# Patient Record
Sex: Female | Born: 1950 | Race: White | Hispanic: No | Smoking: Former smoker
Health system: Southern US, Community
[De-identification: ages and names within clinical notes are randomized; demographics above are authoritative.]

## PROBLEM LIST (undated history)

## (undated) DIAGNOSIS — F32A Depression, unspecified: Secondary | ICD-10-CM

## (undated) DIAGNOSIS — R51 Headache: Secondary | ICD-10-CM

## (undated) DIAGNOSIS — T8859XA Other complications of anesthesia, initial encounter: Secondary | ICD-10-CM

## (undated) DIAGNOSIS — F329 Major depressive disorder, single episode, unspecified: Secondary | ICD-10-CM

## (undated) DIAGNOSIS — F419 Anxiety disorder, unspecified: Secondary | ICD-10-CM

## (undated) DIAGNOSIS — R519 Headache, unspecified: Secondary | ICD-10-CM

## (undated) DIAGNOSIS — M5126 Other intervertebral disc displacement, lumbar region: Secondary | ICD-10-CM

## (undated) DIAGNOSIS — M51369 Other intervertebral disc degeneration, lumbar region without mention of lumbar back pain or lower extremity pain: Secondary | ICD-10-CM

## (undated) DIAGNOSIS — R112 Nausea with vomiting, unspecified: Secondary | ICD-10-CM

## (undated) DIAGNOSIS — Z9889 Other specified postprocedural states: Secondary | ICD-10-CM

## (undated) DIAGNOSIS — M48061 Spinal stenosis, lumbar region without neurogenic claudication: Secondary | ICD-10-CM

## (undated) DIAGNOSIS — I10 Essential (primary) hypertension: Secondary | ICD-10-CM

## (undated) DIAGNOSIS — T4145XA Adverse effect of unspecified anesthetic, initial encounter: Secondary | ICD-10-CM

## (undated) DIAGNOSIS — M1990 Unspecified osteoarthritis, unspecified site: Secondary | ICD-10-CM

## (undated) DIAGNOSIS — M797 Fibromyalgia: Secondary | ICD-10-CM

## (undated) DIAGNOSIS — M5136 Other intervertebral disc degeneration, lumbar region: Secondary | ICD-10-CM

## (undated) DIAGNOSIS — Z8619 Personal history of other infectious and parasitic diseases: Secondary | ICD-10-CM

## (undated) DIAGNOSIS — A159 Respiratory tuberculosis unspecified: Secondary | ICD-10-CM

## (undated) HISTORY — PX: ABDOMINAL HYSTERECTOMY: SHX81

## (undated) HISTORY — PX: COLONOSCOPY: SHX174

## (undated) HISTORY — PX: BACK SURGERY: SHX140

## (undated) HISTORY — PX: WISDOM TOOTH EXTRACTION: SHX21

## (undated) HISTORY — PX: TONSILLECTOMY: SUR1361

## (undated) HISTORY — PX: TUBAL LIGATION: SHX77

## (undated) HISTORY — PX: APPENDECTOMY: SHX54

## (undated) HISTORY — PX: CHOLECYSTECTOMY: SHX55

---

## 1997-11-11 ENCOUNTER — Other Ambulatory Visit: Admission: RE | Admit: 1997-11-11 | Discharge: 1997-11-11 | Payer: Self-pay | Admitting: *Deleted

## 1998-02-12 ENCOUNTER — Ambulatory Visit (HOSPITAL_COMMUNITY): Admission: RE | Admit: 1998-02-12 | Discharge: 1998-02-12 | Payer: Self-pay | Admitting: *Deleted

## 1998-02-24 ENCOUNTER — Ambulatory Visit (HOSPITAL_COMMUNITY): Admission: RE | Admit: 1998-02-24 | Discharge: 1998-02-24 | Payer: Self-pay | Admitting: *Deleted

## 1999-04-23 ENCOUNTER — Other Ambulatory Visit: Admission: RE | Admit: 1999-04-23 | Discharge: 1999-04-23 | Payer: Self-pay | Admitting: *Deleted

## 2001-12-31 ENCOUNTER — Encounter: Admission: RE | Admit: 2001-12-31 | Discharge: 2001-12-31 | Payer: Self-pay | Admitting: Internal Medicine

## 2002-01-14 ENCOUNTER — Encounter: Admission: RE | Admit: 2002-01-14 | Discharge: 2002-01-14 | Payer: Self-pay | Admitting: Internal Medicine

## 2002-03-18 ENCOUNTER — Encounter: Admission: RE | Admit: 2002-03-18 | Discharge: 2002-03-18 | Payer: Self-pay | Admitting: Internal Medicine

## 2002-03-28 ENCOUNTER — Ambulatory Visit (HOSPITAL_COMMUNITY): Admission: RE | Admit: 2002-03-28 | Discharge: 2002-03-28 | Payer: Self-pay | Admitting: Internal Medicine

## 2002-04-02 ENCOUNTER — Encounter: Admission: RE | Admit: 2002-04-02 | Discharge: 2002-07-01 | Payer: Self-pay | Admitting: *Deleted

## 2003-05-13 ENCOUNTER — Emergency Department (HOSPITAL_COMMUNITY): Admission: AD | Admit: 2003-05-13 | Discharge: 2003-05-13 | Payer: Self-pay | Admitting: Family Medicine

## 2004-05-14 ENCOUNTER — Ambulatory Visit: Payer: Self-pay | Admitting: Internal Medicine

## 2004-05-17 ENCOUNTER — Ambulatory Visit: Payer: Self-pay | Admitting: Internal Medicine

## 2006-04-19 ENCOUNTER — Ambulatory Visit: Payer: Self-pay | Admitting: Obstetrics and Gynecology

## 2008-11-14 ENCOUNTER — Encounter: Payer: Self-pay | Admitting: Orthopedic Surgery

## 2009-06-23 ENCOUNTER — Ambulatory Visit: Payer: Self-pay | Admitting: Gynecology

## 2009-06-23 ENCOUNTER — Other Ambulatory Visit: Admission: RE | Admit: 2009-06-23 | Discharge: 2009-06-23 | Payer: Self-pay | Admitting: Gynecology

## 2009-07-02 ENCOUNTER — Ambulatory Visit: Payer: Self-pay | Admitting: Gynecology

## 2009-09-16 ENCOUNTER — Ambulatory Visit (HOSPITAL_COMMUNITY): Admission: RE | Admit: 2009-09-16 | Discharge: 2009-09-18 | Payer: Self-pay | Admitting: Specialist

## 2009-10-07 ENCOUNTER — Ambulatory Visit: Payer: Self-pay | Admitting: Gynecology

## 2009-10-21 ENCOUNTER — Emergency Department (HOSPITAL_COMMUNITY): Admission: EM | Admit: 2009-10-21 | Discharge: 2009-10-21 | Payer: Self-pay | Admitting: Emergency Medicine

## 2009-10-26 ENCOUNTER — Ambulatory Visit: Payer: Self-pay | Admitting: Gynecology

## 2009-10-26 ENCOUNTER — Ambulatory Visit (HOSPITAL_BASED_OUTPATIENT_CLINIC_OR_DEPARTMENT_OTHER): Admission: RE | Admit: 2009-10-26 | Discharge: 2009-10-27 | Payer: Self-pay | Admitting: Gynecology

## 2009-11-10 ENCOUNTER — Ambulatory Visit: Payer: Self-pay | Admitting: Gynecology

## 2009-11-26 ENCOUNTER — Ambulatory Visit: Payer: Self-pay | Admitting: Gynecology

## 2010-06-26 ENCOUNTER — Encounter: Payer: Self-pay | Admitting: Internal Medicine

## 2010-06-28 ENCOUNTER — Encounter: Payer: Self-pay | Admitting: Family Medicine

## 2010-08-23 LAB — POCT I-STAT, CHEM 8
BUN: 11 mg/dL (ref 6–23)
Chloride: 108 mEq/L (ref 96–112)
Chloride: 109 mEq/L (ref 96–112)
Creatinine, Ser: 0.5 mg/dL (ref 0.4–1.2)
Creatinine, Ser: 0.7 mg/dL (ref 0.4–1.2)
Glucose, Bld: 113 mg/dL — ABNORMAL HIGH (ref 70–99)
Glucose, Bld: 85 mg/dL (ref 70–99)
HCT: 37 % (ref 36.0–46.0)
HCT: 39 % (ref 36.0–46.0)
Hemoglobin: 12.6 g/dL (ref 12.0–15.0)
Potassium: 3.5 mEq/L (ref 3.5–5.1)
Sodium: 143 mEq/L (ref 135–145)
Sodium: 143 mEq/L (ref 135–145)

## 2010-08-23 LAB — CBC
Hemoglobin: 11.8 g/dL — ABNORMAL LOW (ref 12.0–15.0)
MCHC: 34 g/dL (ref 30.0–36.0)
MCV: 83.4 fL (ref 78.0–100.0)
Platelets: 379 10*3/uL (ref 150–400)
RBC: 4.16 MIL/uL (ref 3.87–5.11)
RDW: 13.2 % (ref 11.5–15.5)

## 2010-08-23 LAB — URINALYSIS, ROUTINE W REFLEX MICROSCOPIC
Glucose, UA: 100 mg/dL — AB
Protein, ur: 100 mg/dL — AB
Specific Gravity, Urine: 1.029 (ref 1.005–1.030)
pH: 6.5 (ref 5.0–8.0)

## 2010-08-23 LAB — URINE MICROSCOPIC-ADD ON

## 2010-08-25 LAB — COMPREHENSIVE METABOLIC PANEL
ALT: 17 U/L (ref 0–35)
Albumin: 4.2 g/dL (ref 3.5–5.2)
Calcium: 9.6 mg/dL (ref 8.4–10.5)
Creatinine, Ser: 0.66 mg/dL (ref 0.4–1.2)
GFR calc Af Amer: >60 mL/min (ref >60)
GFR calc non Af Amer: >60 mL/min (ref >60)
Glucose, Bld: 105 mg/dL — ABNORMAL HIGH (ref 70–99)
Potassium: 3.6 mEq/L (ref 3.5–5.1)
Total Protein: 7.7 g/dL (ref 6.0–8.3)

## 2010-08-25 LAB — BASIC METABOLIC PANEL
CO2: 25 mEq/L (ref 19–32)
Chloride: 104 mEq/L (ref 96–112)
Creatinine, Ser: 0.6 mg/dL (ref 0.4–1.2)
GFR calc Af Amer: >60 mL/min (ref >60)
Glucose, Bld: 120 mg/dL — ABNORMAL HIGH (ref 70–99)
Potassium: 3.7 mEq/L (ref 3.5–5.1)
Sodium: 138 mEq/L (ref 135–145)

## 2010-08-25 LAB — APTT: aPTT: 32 seconds (ref 24–37)

## 2010-08-25 LAB — URINALYSIS, ROUTINE W REFLEX MICROSCOPIC
Ketones, ur: NEGATIVE mg/dL
Nitrite: NEGATIVE
Protein, ur: NEGATIVE mg/dL
Specific Gravity, Urine: 1.021 (ref 1.005–1.030)

## 2010-08-25 LAB — CBC
HCT: 32.4 % — ABNORMAL LOW (ref 36.0–46.0)
HCT: 40 % (ref 36.0–46.0)
Hemoglobin: 11.3 g/dL — ABNORMAL LOW (ref 12.0–15.0)
Hemoglobin: 13.8 g/dL (ref 12.0–15.0)
MCV: 83.9 fL (ref 78.0–100.0)
Platelets: 234 10*3/uL (ref 150–400)
RBC: 4.76 MIL/uL (ref 3.87–5.11)
WBC: 10.7 10*3/uL — ABNORMAL HIGH (ref 4.0–10.5)

## 2010-08-25 LAB — TYPE AND SCREEN: Antibody Screen: NEGATIVE

## 2010-08-25 LAB — ABO/RH: ABO/RH(D): B POS

## 2010-08-25 LAB — DIFFERENTIAL
Basophils Relative: 0 % (ref 0–1)
Neutrophils Relative %: 68 % (ref 43–77)

## 2010-08-25 LAB — PROTIME-INR
INR: 1.04 (ref 0.00–1.49)
Prothrombin Time: 13.5 seconds (ref 11.6–15.2)

## 2010-10-22 NOTE — Group Therapy Note (Signed)
NAMERAYLAN, TROIANI             ACCOUNT NO.:  [id number]   MEDICAL RECORD NO.:  [id number]          PATIENT TYPE:  WOC   LOCATION:  [facility]                   FACILITY:  [facility]   PHYSICIAN:  Argentina Donovan, MD        DATE OF BIRTH:  04-May-1951   DATE OF SERVICE:                                    CLINIC NOTE   The patient is a 60 year old postmenopausal patient who is sent in by her  primary doctor, Dr. Egbert Garibaldi, who recently did an endometrial biopsy for  postmenopausal bleeding, and found a benign endometrium with small evidence  of a polyp.  She has not had any bleeding now in over a month, and she also  complains of some cramping with her period starting, especially on the right  side and radiating around to the back, but no bleeding.  We are going to get  an ultrasound to make sure she has no sign of any ovarian pathology, and  evaluate the uterus a little bit better.  We have told her if the bleeding  restarts, I want to do a more formal D&C and hysteroscopy, and she is going  to call.  In addition to that, she has had a recent Pap smear, which has  been completely normal.   IMPRESSION:  1. Postmenopausal bleeding, resolved.  2. Endometrial biopsy was done by a previous doctor.  3. Pelvic pain.           ______________________________  Argentina Donovan, MD     PR/MEDQ  D:  04/19/2006  T:  04/19/2006  Job:  [id number]

## 2013-04-01 ENCOUNTER — Encounter (HOSPITAL_COMMUNITY): Payer: Self-pay | Admitting: Pharmacy Technician

## 2013-04-02 ENCOUNTER — Other Ambulatory Visit (HOSPITAL_COMMUNITY): Payer: Self-pay | Admitting: *Deleted

## 2013-04-02 NOTE — Patient Instructions (Addendum)
20      Your procedure is scheduled on:  Tuesday 04/09/2013  Report to [facility] at  0915 AM.  Call this number if you have problems the night before or morning of surgery: [phone]   Remember:             IF YOU USE CPAP,BRING MASK AND TUBING AM OF SURGERY!   Do not eat food or drink liquids AFTER MIDNIGHT!  Take these medicines the morning of surgery with A SIP OF WATER:Atenolol, Amlodipine, Wellbutrin , Hydrocodone if needed   Do not bring valuables to the hospital. [facility] IS NOT RESPONSIBLE  FOR ANY BELONGINGS OR VALUABLES BROUGHT TO HOSPITAL.  Marland Kitchen  Leave suitcase in the car. After surgery it may be brought to your room.  For patients admitted to the hospital, checkout time is 11:00 AM the day of              Discharge.    DO NOT WEAR JEWELRY , MAKE-UP, LOTIONS,POWDERS,PERFUMES!             WOMEN -DO NOT SHAVE LEGS OR UNDERARMS 12 HRS. BEFORE SURGERY!               MEN MAY SHAVE AS USUAL!             CONTACTS,DENTURES OR BRIDGEWORK, FALSE EYELASHES MAY NOT BE WORN INTO SURGERY!                                           Patients discharged the day of surgery will not be allowed to drive home. If going home the same day of surgery, must have someone stay with you first 24 hrs.at home and arrange for someone to drive you home from the Hospital.                       YOUR DRIVER IS:N/A   Special Instructions:             Please read over the following fact sheets that you were given:             1. [place] PREPARING FOR SURGERY SHEET              2.INCENTIVE SPIROMETRY                                        [place].Kathaleen Dudziak,RN,BSN     [id number]                FAILURE TO FOLLOW THESE INSTRUCTIONS MAY RESULT IN  CANCELLATION OF YOUR SURGERY!               Patient Signature:___________________________

## 2013-04-03 ENCOUNTER — Encounter (HOSPITAL_COMMUNITY): Payer: Self-pay

## 2013-04-03 ENCOUNTER — Ambulatory Visit (HOSPITAL_COMMUNITY)
Admission: RE | Admit: 2013-04-03 | Discharge: 2013-04-03 | Disposition: A | Discharge status: Home / Self Care | Payer: Medicare Other | Source: Ambulatory Visit | Attending: Orthopedic Surgery | Admitting: Orthopedic Surgery

## 2013-04-03 ENCOUNTER — Encounter (HOSPITAL_COMMUNITY)
Admission: RE | Admit: 2013-04-03 | Discharge: 2013-04-03 | Disposition: A | Discharge status: Home / Self Care | Payer: Medicare Other | Source: Ambulatory Visit | Attending: Orthopedic Surgery | Admitting: Orthopedic Surgery

## 2013-04-03 DIAGNOSIS — M479 Spondylosis, unspecified: Secondary | ICD-10-CM | POA: Insufficient documentation

## 2013-04-03 DIAGNOSIS — R091 Pleurisy: Secondary | ICD-10-CM | POA: Insufficient documentation

## 2013-04-03 DIAGNOSIS — Z01818 Encounter for other preprocedural examination: Secondary | ICD-10-CM | POA: Insufficient documentation

## 2013-04-03 HISTORY — DX: Unspecified osteoarthritis, unspecified site: M19.90

## 2013-04-03 HISTORY — DX: Depression, unspecified: F32.A

## 2013-04-03 HISTORY — DX: Anxiety disorder, unspecified: F41.9

## 2013-04-03 HISTORY — DX: Major depressive disorder, single episode, unspecified: F32.9

## 2013-04-03 HISTORY — DX: Essential (primary) hypertension: I10

## 2013-04-03 LAB — BASIC METABOLIC PANEL
BUN: 12 mg/dL (ref 6–23)
CO2: 28 mEq/L (ref 19–32)
Chloride: 103 mEq/L (ref 96–112)
Creatinine, Ser: 0.56 mg/dL (ref 0.50–1.10)
GFR calc Af Amer: >90 mL/min (ref >90)
GFR calc non Af Amer: >90 mL/min (ref >90)
Potassium: 3.8 mEq/L (ref 3.5–5.1)

## 2013-04-03 LAB — CBC
HCT: 38.4 % (ref 36.0–46.0)
Hemoglobin: 13.3 g/dL (ref 12.0–15.0)
MCHC: 34.6 g/dL (ref 30.0–36.0)
MCV: 82.9 fL (ref 78.0–100.0)
RBC: 4.63 MIL/uL (ref 3.87–5.11)
RDW: 13.4 % (ref 11.5–15.5)

## 2013-04-03 LAB — PROTIME-INR
INR: 1.01 (ref 0.00–1.49)
Prothrombin Time: 13.1 seconds (ref 11.6–15.2)

## 2013-04-03 LAB — SURGICAL PCR SCREEN: Staphylococcus aureus: NEGATIVE

## 2013-04-03 NOTE — Progress Notes (Signed)
04/03/13 1558  OBSTRUCTIVE SLEEP APNEA  Have you ever been diagnosed with sleep apnea through a sleep study? No  Do you snore loudly (loud enough to be heard through closed doors)?  1  Do you often feel tired, fatigued, or sleepy during the daytime? 1  Has anyone observed you stop breathing during your sleep? 0  Do you have, or are you being treated for high blood pressure? 1  BMI more than 35 kg/m2? 0  Age over 63 years old? 1  Neck circumference greater than 40 cm/18 inches? 0  Gender: 0  Obstructive Sleep Apnea Score 4  Score 4 or greater  Results sent to PCP

## 2013-04-03 NOTE — Progress Notes (Signed)
Office visit from 03/07/2013 from Dr. Egbert Garibaldi and surgical clearance on chart.

## 2013-04-04 LAB — URINALYSIS, ROUTINE W REFLEX MICROSCOPIC
Bilirubin Urine: NEGATIVE
Leukocytes, UA: NEGATIVE
Nitrite: NEGATIVE
Protein, ur: NEGATIVE mg/dL
Specific Gravity, Urine: 1.026 (ref 1.005–1.030)
Urobilinogen, UA: 0.2 mg/dL (ref 0.0–1.0)

## 2013-04-08 NOTE — H&P (Signed)
TOTAL HIP ADMISSION H&P  Patient is admitted for right total hip arthroplasty, anterior approach.  Subjective:  Chief Complaint: Right hip OA / pain  HPI: Rebecca Morse, 62 y.o. female, has a history of pain and functional disability in the right hip(s) due to arthritis and patient has failed non-surgical conservative treatments for greater than 12 weeks to include NSAID's and/or analgesics, corticosteriod injections, use of assistive devices and activity modification.  Onset of symptoms was gradual starting 2.5 years ago with rapidlly worsening course since that time.The patient noted no past surgery on the right hip(s).  Patient currently rates pain in the right hip at 8 out of 10 with activity. Patient has night pain, worsening of pain with activity and weight bearing, trendelenberg gait, pain that interfers with activities of daily living and pain with passive range of motion. Patient has evidence of periarticular osteophytes and joint space narrowing by imaging studies. This condition presents safety issues increasing the risk of falls.  There is no current active infection.  Risks, benefits and expectations were discussed with the patient. Patient understand the risks, benefits and expectations and wishes to proceed with surgery.   D/C Plans:   Home with HHPT  Post-op Meds:    No Rx given  Tranexamic Acid:   To be given  Decadron:    To be given  FYI:    ASA post-op  Norco Post-op    Past Medical History  Diagnosis Date  . Hypertension   . Anxiety   . Depression   . Arthritis     Past Surgical History  Procedure Laterality Date  . Back surgery    . Tonsillectomy    . Abdominal hysterectomy    . Tubal ligation    . Cholecystectomy    . Appendectomy      No prescriptions prior to admission   No Known Allergies   History  Substance Use Topics  . Smoking status: Former Smoker    Quit date: 06/06/1998  . Smokeless tobacco: Not on file  . Alcohol Use: Yes   Comment: occassionally    No family history on file.   Review of Systems  Constitutional: Positive for malaise/fatigue.  Respiratory: Positive for shortness of breath (on exertion).   Gastrointestinal: Positive for heartburn.  Musculoskeletal: Positive for back pain, joint pain and myalgias.  Skin: Negative.   Neurological: Positive for headaches.  Psychiatric/Behavioral: Positive for depression. The patient is nervous/anxious.     Objective:  Physical Exam  Constitutional: She is oriented to person, place, and time. She appears well-developed and well-nourished.  HENT:  Head: Normocephalic and atraumatic.  Mouth/Throat: Oropharynx is clear and moist.  Eyes: Pupils are equal, round, and reactive to light.  Neck: Neck supple. No JVD present. No tracheal deviation present. No thyromegaly present.  Cardiovascular: Normal rate, regular rhythm, normal heart sounds and intact distal pulses.   Respiratory: Effort normal and breath sounds normal. No stridor.  GI: Soft. There is no tenderness. There is no guarding.  Musculoskeletal:       Right hip: She exhibits decreased range of motion, decreased strength, tenderness and bony tenderness. She exhibits no swelling, no deformity and no laceration.  Lymphadenopathy:    She has no cervical adenopathy.  Neurological: She is alert and oriented to person, place, and time.  Skin: Skin is warm and dry.  Psychiatric: She has a normal mood and affect.    Imaging Review Plain radiographs demonstrate severe degenerative joint disease of the right hip(s).  The bone quality appears to be good for age and reported activity level.  Assessment/Plan:  End stage arthritis, right hip(s)  The patient history, physical examination, clinical judgement of the provider and imaging studies are consistent with end stage degenerative joint disease of the right hip(s) and total hip arthroplasty is deemed medically necessary. The treatment options including  medical management, injection therapy, arthroscopy and arthroplasty were discussed at length. The risks and benefits of total hip arthroplasty were presented and reviewed. The risks due to aseptic loosening, infection, stiffness, dislocation/subluxation,  thromboembolic complications and other imponderables were discussed.  The patient acknowledged the explanation, agreed to proceed with the plan and consent was signed. Patient is being admitted for inpatient treatment for surgery, pain control, PT, OT, prophylactic antibiotics, VTE prophylaxis, progressive ambulation and ADL's and discharge planning.The patient is planning to be discharged home with home health services.   Anastasio Auerbach Rickia [place]burg   PAC  04/08/2013, 7:13 PM

## 2013-04-09 ENCOUNTER — Encounter (HOSPITAL_COMMUNITY): Payer: Medicare Other | Admitting: Anesthesiology

## 2013-04-09 ENCOUNTER — Encounter (HOSPITAL_COMMUNITY): Payer: Self-pay | Admitting: *Deleted

## 2013-04-09 ENCOUNTER — Inpatient Hospital Stay (HOSPITAL_COMMUNITY): Payer: Medicare Other

## 2013-04-09 ENCOUNTER — Inpatient Hospital Stay (HOSPITAL_COMMUNITY): Payer: Medicare Other | Admitting: Anesthesiology

## 2013-04-09 ENCOUNTER — Inpatient Hospital Stay (HOSPITAL_COMMUNITY)
Admission: RE | Admit: 2013-04-09 | Discharge: 2013-04-11 | DRG: 470 | Disposition: A | Discharge status: Home Health | Payer: Medicare Other | Source: Ambulatory Visit | Attending: Orthopedic Surgery | Admitting: Orthopedic Surgery

## 2013-04-09 ENCOUNTER — Encounter (HOSPITAL_COMMUNITY)
Admission: RE | Disposition: A | Discharge status: Home Health | Payer: Self-pay | Source: Ambulatory Visit | Attending: Orthopedic Surgery

## 2013-04-09 DIAGNOSIS — D62 Acute posthemorrhagic anemia: Secondary | ICD-10-CM | POA: Diagnosis not present

## 2013-04-09 DIAGNOSIS — Z96649 Presence of unspecified artificial hip joint: Secondary | ICD-10-CM

## 2013-04-09 DIAGNOSIS — F411 Generalized anxiety disorder: Secondary | ICD-10-CM | POA: Diagnosis present

## 2013-04-09 DIAGNOSIS — F329 Major depressive disorder, single episode, unspecified: Secondary | ICD-10-CM | POA: Diagnosis present

## 2013-04-09 DIAGNOSIS — G4733 Obstructive sleep apnea (adult) (pediatric): Secondary | ICD-10-CM | POA: Diagnosis present

## 2013-04-09 DIAGNOSIS — F3289 Other specified depressive episodes: Secondary | ICD-10-CM | POA: Diagnosis present

## 2013-04-09 DIAGNOSIS — M1610 Unilateral primary osteoarthritis, unspecified hip: Principal | ICD-10-CM | POA: Diagnosis present

## 2013-04-09 DIAGNOSIS — Z6834 Body mass index (BMI) 34.0-34.9, adult: Secondary | ICD-10-CM

## 2013-04-09 DIAGNOSIS — I10 Essential (primary) hypertension: Secondary | ICD-10-CM | POA: Diagnosis present

## 2013-04-09 DIAGNOSIS — Z87891 Personal history of nicotine dependence: Secondary | ICD-10-CM

## 2013-04-09 DIAGNOSIS — D500 Iron deficiency anemia secondary to blood loss (chronic): Secondary | ICD-10-CM | POA: Diagnosis not present

## 2013-04-09 DIAGNOSIS — M169 Osteoarthritis of hip, unspecified: Principal | ICD-10-CM | POA: Diagnosis present

## 2013-04-09 DIAGNOSIS — E669 Obesity, unspecified: Secondary | ICD-10-CM | POA: Diagnosis present

## 2013-04-09 HISTORY — PX: TOTAL HIP ARTHROPLASTY: SHX124

## 2013-04-09 HISTORY — DX: Presence of unspecified artificial hip joint: Z96.649

## 2013-04-09 LAB — TYPE AND SCREEN: ABO/RH(D): B POS

## 2013-04-09 SURGERY — ARTHROPLASTY, HIP, TOTAL, ANTERIOR APPROACH
Anesthesia: General | Site: Hip | Laterality: Right | Wound class: Clean

## 2013-04-09 MED ORDER — SUCCINYLCHOLINE CHLORIDE 20 MG/ML IJ SOLN
INTRAMUSCULAR | Status: DC | PRN
  Administered 2013-04-09: 100 mg via INTRAVENOUS

## 2013-04-09 MED ORDER — NEOSTIGMINE METHYLSULFATE 1 MG/ML IJ SOLN
INTRAMUSCULAR | Status: DC | PRN
  Administered 2013-04-09: 5 mg via INTRAVENOUS

## 2013-04-09 MED ORDER — BUPROPION HCL ER (SR) 150 MG PO TB12
150.0000 mg | ORAL_TABLET | Freq: Two times a day (BID) | ORAL | Status: DC
  Administered 2013-04-09 – 2013-04-11 (×4): 150 mg via ORAL
  Filled 2013-04-09 (×5): qty 1

## 2013-04-09 MED ORDER — CLINDAMYCIN PHOSPHATE 900 MG/50ML IV SOLN
900.0000 mg | Freq: Four times a day (QID) | INTRAVENOUS | Status: AC
Start: 2013-04-09 — End: 2013-04-10
  Administered 2013-04-09 – 2013-04-10 (×2): 900 mg via INTRAVENOUS
  Filled 2013-04-09 (×2): qty 50

## 2013-04-09 MED ORDER — FERROUS SULFATE 325 (65 FE) MG PO TABS
325.0000 mg | ORAL_TABLET | Freq: Three times a day (TID) | ORAL | Status: DC
  Administered 2013-04-10 – 2013-04-11 (×4): 325 mg via ORAL
  Filled 2013-04-09 (×8): qty 1

## 2013-04-09 MED ORDER — LIDOCAINE HCL (CARDIAC) 20 MG/ML IV SOLN
INTRAVENOUS | Status: DC | PRN
  Administered 2013-04-09: 100 mg via INTRAVENOUS

## 2013-04-09 MED ORDER — FAMOTIDINE IN NACL 20-0.9 MG/50ML-% IV SOLN
20.0000 mg | Freq: Once | INTRAVENOUS | Status: AC
  Administered 2013-04-09: 20 mg via INTRAVENOUS
  Filled 2013-04-09: qty 50

## 2013-04-09 MED ORDER — PROPOFOL 10 MG/ML IV BOLUS
INTRAVENOUS | Status: DC | PRN
  Administered 2013-04-09: 160 mg via INTRAVENOUS

## 2013-04-09 MED ORDER — FLEET ENEMA 7-19 GM/118ML RE ENEM: 1.0000 | ENEMA | Freq: Once | RECTAL | Status: AC | PRN

## 2013-04-09 MED ORDER — HYDROMORPHONE HCL PF 1 MG/ML IJ SOLN: 0.5000 mg | INTRAMUSCULAR | Status: DC | PRN

## 2013-04-09 MED ORDER — STERILE WATER FOR IRRIGATION IR SOLN
Status: DC | PRN
  Administered 2013-04-09: 3000 mL

## 2013-04-09 MED ORDER — CEFAZOLIN SODIUM-DEXTROSE 2-3 GM-% IV SOLR
2.0000 g | INTRAVENOUS | Status: AC
  Administered 2013-04-09: 2 g via INTRAVENOUS

## 2013-04-09 MED ORDER — ONDANSETRON HCL 4 MG/2ML IJ SOLN: 4.0000 mg | Freq: Four times a day (QID) | INTRAMUSCULAR | Status: DC | PRN

## 2013-04-09 MED ORDER — METOCLOPRAMIDE HCL 10 MG PO TABS: 5.0000 mg | ORAL_TABLET | Freq: Three times a day (TID) | ORAL | Status: DC | PRN

## 2013-04-09 MED ORDER — BISACODYL 10 MG RE SUPP: 10.0000 mg | Freq: Every day | RECTAL | Status: DC | PRN

## 2013-04-09 MED ORDER — PHENOL 1.4 % MT LIQD: 1.0000 | OROMUCOSAL | Status: DC | PRN

## 2013-04-09 MED ORDER — DEXAMETHASONE SODIUM PHOSPHATE 10 MG/ML IJ SOLN
10.0000 mg | Freq: Once | INTRAMUSCULAR | Status: AC
  Administered 2013-04-10: 10 mg via INTRAVENOUS
  Filled 2013-04-09: qty 1

## 2013-04-09 MED ORDER — FENTANYL CITRATE 0.05 MG/ML IJ SOLN
INTRAMUSCULAR | Status: DC | PRN
  Administered 2013-04-09: 100 ug via INTRAVENOUS
  Administered 2013-04-09 (×2): 50 ug via INTRAVENOUS

## 2013-04-09 MED ORDER — DEXAMETHASONE SODIUM PHOSPHATE 10 MG/ML IJ SOLN: 10.0000 mg | Freq: Once | INTRAMUSCULAR | Status: DC

## 2013-04-09 MED ORDER — 0.9 % SODIUM CHLORIDE (POUR BTL) OPTIME
TOPICAL | Status: DC | PRN
  Administered 2013-04-09: 1000 mL

## 2013-04-09 MED ORDER — AMLODIPINE BESYLATE 10 MG PO TABS
10.0000 mg | ORAL_TABLET | Freq: Every day | ORAL | Status: DC
  Administered 2013-04-10 – 2013-04-11 (×2): 10 mg via ORAL
  Filled 2013-04-09 (×2): qty 1

## 2013-04-09 MED ORDER — ROCURONIUM BROMIDE 100 MG/10ML IV SOLN
INTRAVENOUS | Status: DC | PRN
  Administered 2013-04-09: 10 mg via INTRAVENOUS
  Administered 2013-04-09: 30 mg via INTRAVENOUS

## 2013-04-09 MED ORDER — ZOLPIDEM TARTRATE 5 MG PO TABS
5.0000 mg | ORAL_TABLET | Freq: Every evening | ORAL | Status: DC | PRN
  Administered 2013-04-10: 5 mg via ORAL
  Filled 2013-04-09: qty 1

## 2013-04-09 MED ORDER — LACTATED RINGERS IV SOLN
INTRAVENOUS | Status: DC
  Administered 2013-04-09: 1000 mL via INTRAVENOUS
  Administered 2013-04-09 (×2): via INTRAVENOUS

## 2013-04-09 MED ORDER — ASPIRIN EC 325 MG PO TBEC
325.0000 mg | DELAYED_RELEASE_TABLET | Freq: Two times a day (BID) | ORAL | Status: DC
  Administered 2013-04-10 – 2013-04-11 (×3): 325 mg via ORAL
  Filled 2013-04-09 (×5): qty 1

## 2013-04-09 MED ORDER — CELECOXIB 200 MG PO CAPS
200.0000 mg | ORAL_CAPSULE | Freq: Two times a day (BID) | ORAL | Status: DC
  Administered 2013-04-09: 200 mg via ORAL
  Filled 2013-04-09 (×5): qty 1

## 2013-04-09 MED ORDER — DIPHENHYDRAMINE HCL 25 MG PO CAPS: 25.0000 mg | ORAL_CAPSULE | Freq: Four times a day (QID) | ORAL | Status: DC | PRN

## 2013-04-09 MED ORDER — MENTHOL 3 MG MT LOZG
1.0000 | LOZENGE | OROMUCOSAL | Status: DC | PRN
  Filled 2013-04-09: qty 9

## 2013-04-09 MED ORDER — HYDROMORPHONE HCL PF 1 MG/ML IJ SOLN
INTRAMUSCULAR | Status: AC
  Filled 2013-04-09: qty 1

## 2013-04-09 MED ORDER — HYDROCODONE-ACETAMINOPHEN 7.5-325 MG PO TABS
1.0000 | ORAL_TABLET | ORAL | Status: DC
  Administered 2013-04-09 (×2): 2 via ORAL
  Administered 2013-04-10: 1 via ORAL
  Administered 2013-04-10 – 2013-04-11 (×6): 2 via ORAL
  Filled 2013-04-09 (×10): qty 2

## 2013-04-09 MED ORDER — MIDAZOLAM HCL 5 MG/5ML IJ SOLN
INTRAMUSCULAR | Status: DC | PRN
  Administered 2013-04-09: 2 mg via INTRAVENOUS

## 2013-04-09 MED ORDER — POLYETHYLENE GLYCOL 3350 17 G PO PACK
17.0000 g | PACK | Freq: Two times a day (BID) | ORAL | Status: DC
  Administered 2013-04-09 – 2013-04-11 (×3): 17 g via ORAL

## 2013-04-09 MED ORDER — METOCLOPRAMIDE HCL 5 MG/ML IJ SOLN: 5.0000 mg | Freq: Three times a day (TID) | INTRAMUSCULAR | Status: DC | PRN

## 2013-04-09 MED ORDER — METHOCARBAMOL 500 MG PO TABS
500.0000 mg | ORAL_TABLET | Freq: Four times a day (QID) | ORAL | Status: DC | PRN
  Administered 2013-04-10 – 2013-04-11 (×3): 500 mg via ORAL
  Filled 2013-04-09 (×3): qty 1

## 2013-04-09 MED ORDER — ALPRAZOLAM 0.5 MG PO TABS
0.5000 mg | ORAL_TABLET | Freq: Three times a day (TID) | ORAL | Status: DC | PRN
Start: 2013-04-09 — End: 2013-04-11

## 2013-04-09 MED ORDER — SODIUM CHLORIDE 0.9 % IV SOLN
10.0000 mg | INTRAVENOUS | Status: DC | PRN
  Administered 2013-04-09: 20 ug/min via INTRAVENOUS

## 2013-04-09 MED ORDER — DIPHENHYDRAMINE HCL 50 MG/ML IJ SOLN
INTRAMUSCULAR | Status: DC | PRN
  Administered 2013-04-09: 50 mg via INTRAVENOUS

## 2013-04-09 MED ORDER — ONDANSETRON HCL 4 MG/2ML IJ SOLN
INTRAMUSCULAR | Status: DC | PRN
  Administered 2013-04-09: 4 mg via INTRAVENOUS

## 2013-04-09 MED ORDER — CEFAZOLIN SODIUM-DEXTROSE 2-3 GM-% IV SOLR
INTRAVENOUS | Status: AC
  Filled 2013-04-09: qty 50

## 2013-04-09 MED ORDER — METHOCARBAMOL 100 MG/ML IJ SOLN
500.0000 mg | Freq: Four times a day (QID) | INTRAVENOUS | Status: DC | PRN
  Administered 2013-04-09: 500 mg via INTRAVENOUS
  Filled 2013-04-09: qty 5

## 2013-04-09 MED ORDER — HYDROMORPHONE HCL PF 1 MG/ML IJ SOLN
0.2500 mg | INTRAMUSCULAR | Status: DC | PRN
  Administered 2013-04-09 (×2): 0.5 mg via INTRAVENOUS

## 2013-04-09 MED ORDER — PHENYLEPHRINE HCL 10 MG/ML IJ SOLN
INTRAMUSCULAR | Status: DC | PRN
  Administered 2013-04-09 (×3): 80 ug via INTRAVENOUS

## 2013-04-09 MED ORDER — DOCUSATE SODIUM 100 MG PO CAPS
100.0000 mg | ORAL_CAPSULE | Freq: Two times a day (BID) | ORAL | Status: DC
  Administered 2013-04-09 – 2013-04-11 (×3): 100 mg via ORAL

## 2013-04-09 MED ORDER — GLYCOPYRROLATE 0.2 MG/ML IJ SOLN
INTRAMUSCULAR | Status: DC | PRN
  Administered 2013-04-09: 0.6 mg via INTRAVENOUS

## 2013-04-09 MED ORDER — DEXAMETHASONE SODIUM PHOSPHATE 10 MG/ML IJ SOLN
INTRAMUSCULAR | Status: DC | PRN
  Administered 2013-04-09: 10 mg via INTRAVENOUS

## 2013-04-09 MED ORDER — ONDANSETRON HCL 4 MG PO TABS
4.0000 mg | ORAL_TABLET | Freq: Four times a day (QID) | ORAL | Status: DC | PRN
  Administered 2013-04-11 (×2): 4 mg via ORAL
  Filled 2013-04-09 (×2): qty 1

## 2013-04-09 MED ORDER — SODIUM CHLORIDE 0.9 % IV SOLN
100.0000 mL/h | INTRAVENOUS | Status: DC
  Administered 2013-04-09: 100 mL/h via INTRAVENOUS
  Filled 2013-04-09 (×7): qty 1000

## 2013-04-09 MED ORDER — TRANEXAMIC ACID 100 MG/ML IV SOLN
1000.0000 mg | Freq: Once | INTRAVENOUS | Status: AC
  Administered 2013-04-09: 1000 mg via INTRAVENOUS
  Filled 2013-04-09: qty 10

## 2013-04-09 MED ORDER — LACTATED RINGERS IV SOLN: INTRAVENOUS | Status: DC

## 2013-04-09 MED ORDER — ATENOLOL 50 MG PO TABS
50.0000 mg | ORAL_TABLET | Freq: Every day | ORAL | Status: DC
  Administered 2013-04-10 – 2013-04-11 (×2): 50 mg via ORAL
  Filled 2013-04-09 (×2): qty 1

## 2013-04-09 MED ORDER — ALUM & MAG HYDROXIDE-SIMETH 200-200-20 MG/5ML PO SUSP: 30.0000 mL | ORAL | Status: DC | PRN

## 2013-04-09 SURGICAL SUPPLY — 37 items
BAG ZIPLOCK 12X15 (MISCELLANEOUS) IMPLANT
BLADE SAW SGTL 18X1.27X75 (BLADE) ×2 IMPLANT
CAPT HIP PF COP ×2 IMPLANT
DERMABOND ADVANCED (GAUZE/BANDAGES/DRESSINGS) ×1
DERMABOND ADVANCED .7 DNX12 (GAUZE/BANDAGES/DRESSINGS) ×1 IMPLANT
DRAPE C-ARM 42X120 X-RAY (DRAPES) ×2 IMPLANT
DRAPE STERI IOBAN 125X83 (DRAPES) ×2 IMPLANT
DRAPE U-SHAPE 47X51 STRL (DRAPES) ×6 IMPLANT
DRSG AQUACEL AG ADV 3.5X10 (GAUZE/BANDAGES/DRESSINGS) ×2 IMPLANT
DRSG TEGADERM 4X4.75 (GAUZE/BANDAGES/DRESSINGS) IMPLANT
DURAPREP 26ML APPLICATOR (WOUND CARE) ×2 IMPLANT
ELECT BLADE TIP CTD 4 INCH (ELECTRODE) ×2 IMPLANT
ELECT REM PT RETURN 9FT ADLT (ELECTROSURGICAL) ×2
ELECTRODE REM PT RTRN 9FT ADLT (ELECTROSURGICAL) ×1 IMPLANT
EVACUATOR 1/8 PVC DRAIN (DRAIN) IMPLANT
FACESHIELD LNG OPTICON STERILE (SAFETY) ×8 IMPLANT
GAUZE SPONGE 2X2 8PLY STRL LF (GAUZE/BANDAGES/DRESSINGS) IMPLANT
GLOVE BIOGEL PI IND STRL 7.5 (GLOVE) ×1 IMPLANT
GLOVE BIOGEL PI IND STRL 8 (GLOVE) ×1 IMPLANT
GLOVE BIOGEL PI INDICATOR 7.5 (GLOVE) ×1
GLOVE BIOGEL PI INDICATOR 8 (GLOVE) ×1
GLOVE ECLIPSE 8.0 STRL XLNG CF (GLOVE) ×2 IMPLANT
GLOVE ORTHO TXT STRL SZ7.5 (GLOVE) ×4 IMPLANT
GOWN BRE IMP PREV XXLGXLNG (GOWN DISPOSABLE) ×2 IMPLANT
GOWN PREVENTION PLUS LG XLONG (DISPOSABLE) ×2 IMPLANT
KIT BASIN OR (CUSTOM PROCEDURE TRAY) ×2 IMPLANT
PACK TOTAL JOINT (CUSTOM PROCEDURE TRAY) ×2 IMPLANT
PADDING CAST COTTON 6X4 STRL (CAST SUPPLIES) ×2 IMPLANT
SPONGE GAUZE 2X2 STER 10/PKG (GAUZE/BANDAGES/DRESSINGS)
SUCTION FRAZIER 12FR DISP (SUCTIONS) IMPLANT
SUT MNCRL AB 4-0 PS2 18 (SUTURE) ×2 IMPLANT
SUT VIC AB 1 CT1 36 (SUTURE) ×6 IMPLANT
SUT VIC AB 2-0 CT1 27 (SUTURE) ×2
SUT VIC AB 2-0 CT1 TAPERPNT 27 (SUTURE) ×2 IMPLANT
SUT VLOC 180 0 24IN GS25 (SUTURE) ×2 IMPLANT
TOWEL OR 17X26 10 PK STRL BLUE (TOWEL DISPOSABLE) ×2 IMPLANT
TRAY FOLEY CATH 14FRSI W/METER (CATHETERS) ×2 IMPLANT

## 2013-04-09 NOTE — Op Note (Signed)
NAME:  Rebecca Morse                ACCOUNT NO.: [id number]      MEDICAL RECORD NO.: [id number]      FACILITY:  [facility]      PHYSICIAN:  Durene Romans D  DATE OF BIRTH:  Oct 05, 1950     DATE OF PROCEDURE:  04/09/2013                                 OPERATIVE REPORT         PREOPERATIVE DIAGNOSIS: Right  hip osteoarthritis.      POSTOPERATIVE DIAGNOSIS:  Right hip osteoarthritis.      PROCEDURE:  Right total hip replacement through an anterior approach   utilizing DePuy THR system, component size 50mm pinnacle cup, a size 32+4 neutral   Altrex liner, a size 2 Hi Tri Lock stem with a 32+5 delta ceramic   ball.      SURGEON:  Madlyn Frankel. Charlann Boxer, M.D.      ASSISTANT:  Lanney Gins, PA-C      ANESTHESIA:  General.      SPECIMENS:  None.      COMPLICATIONS:  None.      BLOOD LOSS:  400 cc     DRAINS:  None.      INDICATION OF THE PROCEDURE:  Rebecca Morse is a 62 y.o. female who had   presented to office for evaluation of right hip pain.  Radiographs revealed   progressive degenerative changes with bone-on-bone   articulation to the  hip joint.  The patient had painful limited range of   motion significantly affecting their overall quality of life.  The patient was failing to    respond to conservative measures, and at this point was ready   to proceed with more definitive measures.  The patient has noted progressive   degenerative changes in his hip, progressive problems and dysfunction   with regarding the hip prior to surgery.  Consent was obtained for   benefit of pain relief.  Specific risk of infection, DVT, component   failure, dislocation, need for revision surgery, as well discussion of   the anterior versus posterior approach were reviewed.  Consent was   obtained for benefit of anterior pain relief through an anterior   approach.      PROCEDURE IN DETAIL:  The patient was brought to operative theater.   Once adequate anesthesia,  preoperative antibiotics, 2gm Ancef administered.   The patient was positioned supine on the OSI Hanna table.  Once adequate   padding of boney process was carried out, we had predraped out the hip, and  used fluoroscopy to confirm orientation of the pelvis and position.      The right hip was then prepped and draped from proximal iliac crest to   mid thigh with shower curtain technique.      Time-out was performed identifying the patient, planned procedure, and   extremity.     An incision was then made 2 cm distal and lateral to the   anterior superior iliac spine extending over the orientation of the   tensor fascia lata muscle and sharp dissection was carried down to the   fascia of the muscle and protractor placed in the soft tissues.      The fascia was then incised.  The muscle belly was identified and swept  laterally and retractor placed along the superior neck.  Following   cauterization of the circumflex vessels and removing some pericapsular   fat, a second cobra retractor was placed on the inferior neck.  A third   retractor was placed on the anterior acetabulum after elevating the   anterior rectus.  A L-capsulotomy was along the line of the   superior neck to the trochanteric fossa, then extended proximally and   distally.  Tag sutures were placed and the retractors were then placed   intracapsular.  We then identified the trochanteric fossa and   orientation of my neck cut, confirmed this radiographically   and then made a neck osteotomy with the femur on traction.  The femoral   head was removed without difficulty or complication.  Traction was let   off and retractors were placed posterior and anterior around the   acetabulum.      The labrum and foveal tissue were debrided.  I began reaming with a 45mm   reamer and reamed up to 49mm reamer with good bony bed preparation and a 50   cup was chosen.  The final 50mm Pinnacle cup was then impacted under fluoroscopy  to  confirm the depth of penetration and orientation with respect to   abduction.  A screw was placed followed by the hole eliminator.  The final   32+4 neutral Altrex liner was impacted with good visualized rim fit.  The cup was positioned anatomically within the acetabular portion of the pelvis.      At this point, the femur was rolled at 80 degrees.  Further capsule was   released off the inferior aspect of the femoral neck.  I then   released the superior capsule proximally.  The hook was placed laterally   along the femur and elevated manually and held in position with the bed   hook.  The leg was then extended and adducted with the leg rolled to 100   degrees of external rotation.  Once the proximal femur was fully   exposed, I used a box osteotome to set orientation.  I then began   broaching with the starting chili pepper broach and passed this by hand and then broached up to 2.  With the 2 broach in place I chose a high offset neck and did a trial reduction.  The offset was appropriate, leg lengths   appeared to be equal, confirmed radiographically.   Given these findings, I went ahead and dislocated the hip, repositioned all   retractors and positioned the right hip in the extended and abducted position.  The final 2 Hi Tri Lock stem was   chosen and it was impacted down to the level of neck cut.  Based on this   and the trial reduction, a 32+5 delta ceramic ball was chosen and   impacted onto a clean and dry trunnion, and the hip was reduced.  The   hip had been irrigated throughout the case again at this point.  I did   reapproximate the superior capsular leaflet to the anterior leaflet   using #1 Vicryl.  The fascia of the   tensor fascia lata muscle was then reapproximated using #1 Vicryl and 0 V-lock sutures.  The   remaining wound was closed with 2-0 Vicryl and running 4-0 Monocryl.   The hip was cleaned, dried, and dressed sterilely using Dermabond and   Aquacel dressing.  She  was then brought   to recovery room  in stable condition tolerating the procedure well.    Lanney Gins, PA-C was present for the entirety of the case involved from   preoperative positioning, perioperative retractor management, general   facilitation of the case, as well as primary wound closure as assistant.            Madlyn Frankel Charlann Boxer, M.D.            MDO/MEDQ  D:  03/29/2011  T:  03/29/2011  Job:  [id number]      Electronically Signed by Durene Romans M.D. on 04/04/2011 09:15:38 AM

## 2013-04-09 NOTE — Progress Notes (Signed)
Utilization review completed.  

## 2013-04-09 NOTE — Interval H&P Note (Signed)
History and Physical Interval Note:  04/09/2013 10:17 AM  Rebecca Morse  has presented today for surgery, with the diagnosis of Osteosrthritis of the Right Hip  The various methods of treatment have been discussed with the patient and family. After consideration of risks, benefits and other options for treatment, the patient has consented to  Procedure(s): RIGHT TOTAL HIP ARTHROPLASTY ANTERIOR APPROACH (Right) as a surgical intervention .  The patient's history has been reviewed, patient examined, no change in status, stable for surgery.  I have reviewed the patient's chart and labs.  Questions were answered to the patient's satisfaction.     Shelda Pal

## 2013-04-09 NOTE — Anesthesia Preprocedure Evaluation (Addendum)
Anesthesia Evaluation  Patient identified by MRN, date of birth, ID band Patient awake    Reviewed: Allergy & Precautions, H&P , NPO status , Patient's Chart, lab work & pertinent test results, reviewed documented beta blocker date and time   Airway Mallampati: II TM Distance: >3 FB Neck ROM: full    Dental no notable dental hx. (+) Teeth Intact and Dental Advisory Given   Pulmonary  Stop bang - OSA breath sounds clear to auscultation  Pulmonary exam normal       Cardiovascular Exercise Tolerance: Good hypertension, Pt. on medications and Pt. on home beta blockers Rhythm:regular Rate:Normal     Neuro/Psych Back surgery spinal stenosis negative neurological ROS  negative psych ROS   GI/Hepatic negative GI ROS, Neg liver ROS,   Endo/Other  negative endocrine ROS  Renal/GU negative Renal ROS  negative genitourinary   Musculoskeletal   Abdominal   Peds  Hematology negative hematology ROS (+)   Anesthesia Other Findings   Reproductive/Obstetrics negative OB ROS                         Anesthesia Physical Anesthesia Plan  ASA: II  Anesthesia Plan: General   Post-op Pain Management:    Induction:   Airway Management Planned: Oral ETT  Additional Equipment:   Intra-op Plan:   Post-operative Plan: Extubation in OR  Informed Consent: I have reviewed the patients History and Physical, chart, labs and discussed the procedure including the risks, benefits and alternatives for the proposed anesthesia with the patient or authorized representative who has indicated his/her understanding and acceptance.   Dental Advisory Given  Plan Discussed with: CRNA and Surgeon  Anesthesia Plan Comments:         Anesthesia Quick Evaluation

## 2013-04-09 NOTE — Plan of Care (Signed)
Problem: Consults Goal: Diagnosis- Total Joint Replacement Primary Total Hip     

## 2013-04-09 NOTE — Transfer of Care (Signed)
Immediate Anesthesia Transfer of Care Note  Patient: Rebecca Morse  Procedure(s) Performed: Procedure(s): RIGHT TOTAL HIP ARTHROPLASTY ANTERIOR APPROACH (Right)  Patient Location: PACU  Anesthesia Type:General  Level of Consciousness: sedated  Airway & Oxygen Therapy: Patient Spontanous Breathing and Patient connected to face mask oxygen  Post-op Assessment: Report given to PACU RN and Post -op Vital signs reviewed and stable  Post vital signs: Reviewed and stable  Complications: No apparent anesthesia complications

## 2013-04-09 NOTE — Preoperative (Signed)
Beta Blockers   Reason not to administer Beta Blockers:Not Applicable 

## 2013-04-09 NOTE — Anesthesia Postprocedure Evaluation (Signed)
  Anesthesia Post-op Note  Patient: Rebecca Morse  Procedure(s) Performed: Procedure(s) (LRB): RIGHT TOTAL HIP ARTHROPLASTY ANTERIOR APPROACH (Right)  Patient Location: PACU  Anesthesia Type: General  Level of Consciousness: awake and alert   Airway and Oxygen Therapy: Patient Spontanous Breathing  Post-op Pain: mild  Post-op Assessment: Post-op Vital signs reviewed, Patient's Cardiovascular Status Stable, Respiratory Function Stable, Patent Airway and No signs of Nausea or vomiting  Last Vitals:  Filed Vitals:   04/09/13 1345  BP: 150/79  Pulse: 78  Temp:   Resp: 14    Post-op Vital Signs: stable   Complications: No apparent anesthesia complications

## 2013-04-10 LAB — BASIC METABOLIC PANEL
BUN: 11 mg/dL (ref 6–23)
CO2: 26 mEq/L (ref 19–32)
Calcium: 9.5 mg/dL (ref 8.4–10.5)
Chloride: 106 mEq/L (ref 96–112)
Creatinine, Ser: 0.6 mg/dL (ref 0.50–1.10)
Sodium: 140 mEq/L (ref 135–145)

## 2013-04-10 LAB — CBC
HCT: 32.1 % — ABNORMAL LOW (ref 36.0–46.0)
MCH: 28.3 pg (ref 26.0–34.0)
MCHC: 34.9 g/dL (ref 30.0–36.0)
MCV: 81.1 fL (ref 78.0–100.0)
RBC: 3.96 MIL/uL (ref 3.87–5.11)
RDW: 13.3 % (ref 11.5–15.5)
WBC: 13.3 10*3/uL — ABNORMAL HIGH (ref 4.0–10.5)

## 2013-04-10 NOTE — Progress Notes (Signed)
   Subjective: 1 Day Post-Op Procedure(s) (LRB): RIGHT TOTAL HIP ARTHROPLASTY ANTERIOR APPROACH (Right)   Patient reports pain as mild, pain well controlled. No events throughout the night.  Objective:   VITALS:   Filed Vitals:   04/10/13 0700  BP: 142/66  Pulse: 83  Temp: 98.1 F (36.7 C)  Resp:     Neurovascular intact Dorsiflexion/Plantar flexion intact Incision: dressing C/D/I No cellulitis present Compartment soft  LABS  Recent Labs  04/10/13 0423  HGB 11.2*  HCT 32.1*  WBC 13.3*  PLT 245     Recent Labs  04/10/13 0423  NA 140  K 4.2  BUN 11  CREATININE 0.60  GLUCOSE 134*     Assessment/Plan: 1 Day Post-Op Procedure(s) (LRB): RIGHT TOTAL HIP ARTHROPLASTY ANTERIOR APPROACH (Right) Foley cath d/c'ed Advance diet Up with therapy D/C IV fluids Discharge home with home health eventually, when ready.  Expected ABLA  Treated with iron and will observe  Obese (BMI 30-39.9) Estimated body mass index is 34.98 kg/(m^2) as calculated from the following:   Height as of this encounter: 5\' 6"  (1.676 m).   Weight as of this encounter: 98.249 kg (216 lb 9.6 oz). Patient also counseled that weight may inhibit the healing process Patient counseled that losing weight will help with future health issues      Rebecca Morse. Rebecca Morse   PAC  04/10/2013, 7:31 AM

## 2013-04-10 NOTE — Evaluation (Signed)
Occupational Therapy Evaluation Patient Details Name: Rebecca Morse MRN: [id number] DOB: 10/13/1950 Today's Date: 04/10/2013 Time: 9629-5284 OT Time Calculation (min): 24 min  OT Assessment / Plan / Recommendation History of present illness R direct anterior THA   Clinical Impression   Pt up to the 3in1 and practiced with AE. Doing well. Will benefit from skilled OT services to maximize ADL independence for d/c home with family.    OT Assessment  Patient needs continued OT Services    Follow Up Recommendations  No OT follow up;Supervision/Assistance - 24 hour    Barriers to Discharge      Equipment Recommendations  None recommended by OT    Recommendations for Other Services    Frequency  Min 2X/week    Precautions / Restrictions Precautions Precautions: None Restrictions Weight Bearing Restrictions: No   Pertinent Vitals/Pain 2/10 R hip; reposition, ice    ADL  Eating/Feeding: Simulated;Independent Where Assessed - Eating/Feeding: Chair Grooming: Performed;Wash/dry hands;Min guard Where Assessed - Grooming: Unsupported standing Upper Body Bathing: Simulated;Chest;Right arm;Left arm;Abdomen;Set up Where Assessed - Upper Body Bathing: Unsupported sitting Lower Body Bathing: Simulated;Minimal assistance Where Assessed - Lower Body Bathing: Supported sit to stand Upper Body Dressing: Simulated;Set up Where Assessed - Upper Body Dressing: Unsupported sitting Lower Body Dressing: Simulated;Moderate assistance Where Assessed - Lower Body Dressing: Supported sit to Pharmacist, hospital: Performed;Minimal Web designer: Raised toilet seat with arms (or 3-in-1 over toilet) Toileting - Clothing Manipulation and Hygiene: Simulated;Minimal assistance Where Assessed - Engineer, mining and Hygiene: Sit to stand from 3-in-1 or toilet Equipment Used: Long-handled shoe horn;Long-handled sponge;Reacher;Rolling walker;Sock aid ADL Comments:  Demonstrated all AE. Pt practiced with reacher to doff sock with min assist and don with sock aid with min assist. Pt considering AE versus family assist. She has a tubseat that she usually sits back on and pivots around legs into tub.    OT Diagnosis: Generalized weakness  OT Problem List: Decreased strength OT Treatment Interventions: Self-care/ADL training;DME and/or AE instruction;Therapeutic activities;Patient/family education   OT Goals(Current goals can be found in the care plan section) Acute Rehab OT Goals Patient Stated Goal: get up and move better OT Goal Formulation: With patient Time For Goal Achievement: 04/17/13 Potential to Achieve Goals: Good  Visit Information  Last OT Received On: 04/10/13 Assistance Needed: +1 History of Present Illness: R direct anterior THA       Prior Functioning     Home Living Family/patient expects to be discharged to:: Private residence Living Arrangements: Children Available Help at Discharge: Family Type of Home: House Home Equipment: Emergency planning/management officer - 4 wheels;Walker - 2 wheels;Bedside commode;Adaptive equipment Adaptive Equipment: Reacher Prior Function Level of Independence: Independent with assistive device(s) Communication Communication: No difficulties         Vision/Perception     Cognition  Cognition Arousal/Alertness: Awake/alert Behavior During Therapy: WFL for tasks assessed/performed Overall Cognitive Status: Within Functional Limits for tasks assessed    Extremity/Trunk Assessment Upper Extremity Assessment Upper Extremity Assessment: Overall WFL for tasks assessed     Mobility Transfers Transfers: Sit to Stand;Stand to Sit Sit to Stand: 4: Min guard;With upper extremity assist;From chair/3-in-1 Stand to Sit: 4: Min assist;With upper extremity assist;To chair/3-in-1 Details for Transfer Assistance: min verbal cues for hand placement     Exercise     Balance Balance Balance Assessed:  Yes Dynamic Standing Balance Dynamic Standing - Level of Assistance: 4: Min assist (min guard)   End of Session OT - End of Session  Equipment Utilized During Treatment: Rolling walker Activity Tolerance: Patient tolerated treatment well Patient left: in chair;with call bell/phone within reach  GO     Rebecca Morse [phone] 04/10/2013, 10:06 AM

## 2013-04-10 NOTE — Progress Notes (Signed)
Physical Therapy Treatment Patient Details Name: Rebecca Morse MRN: [id number] DOB: Oct 09, 1950 Today's Date: 04/10/2013 Time: 7846-9629 PT Time Calculation (min): 28 min  PT Assessment / Plan / Recommendation  History of Present Illness     PT Comments     Follow Up Recommendations  Home health PT     Does the patient have the potential to tolerate intense rehabilitation     Barriers to Discharge        Equipment Recommendations  None recommended by PT    Recommendations for Other Services OT consult  Frequency 7X/week   Progress towards PT Goals Progress towards PT goals: Progressing toward goals  Plan Current plan remains appropriate    Precautions / Restrictions Precautions Precautions: None Restrictions Weight Bearing Restrictions: No   Pertinent Vitals/Pain 3/10; premed, ice packs provided    Mobility  Transfers Transfers: Sit to Stand;Stand to Sit Sit to Stand: 4: Min guard;With upper extremity assist;From chair/3-in-1 Stand to Sit: 4: Min assist;With upper extremity assist;To chair/3-in-1 Details for Transfer Assistance: min verbal cues for hand placement Ambulation/Gait Ambulation/Gait Assistance: 4: Min assist Ambulation Distance (Feet): 240 Feet Assistive device: Rolling walker Ambulation/Gait Assistance Details: cues for posture, position from RW and sequence Gait Pattern: Step-to pattern;Step-through pattern;Decreased step length - right;Decreased step length - left;Shuffle;Trunk flexed Stairs: No    Exercises     PT Diagnosis:    PT Problem List:   PT Treatment Interventions:     PT Goals (current goals can now be found in the care plan section) Acute Rehab PT Goals Patient Stated Goal: get up and move better PT Goal Formulation: With patient Time For Goal Achievement: 04/17/13 Potential to Achieve Goals: Good  Visit Information  Last PT Received On: 04/10/13 Assistance Needed: +1    Subjective Data  Patient Stated Goal: get up and  move better   Cognition  Cognition Arousal/Alertness: Awake/alert Behavior During Therapy: [facility] for tasks assessed/performed Overall Cognitive Status: Within Functional Limits for tasks assessed    Balance     End of Session PT - End of Session Equipment Utilized During Treatment: Gait belt Activity Tolerance: Patient tolerated treatment well Patient left: in chair;with call bell/phone within reach Nurse Communication: Mobility status   GP     Swayze Pries 04/10/2013, 3:52 PM

## 2013-04-10 NOTE — Progress Notes (Signed)
[facility]  [facility] is providing the following services: Patient declined rw and commode - has both at home already.   If patient discharges after hours, please call [phone].   Renard Hamper 04/10/2013, 9:50 AM

## 2013-04-10 NOTE — Evaluation (Signed)
Physical Therapy Evaluation Patient Details Name: Rebecca Morse MRN: [id number] DOB: June 30, 1950 Today's Date: 04/10/2013 Time: 1478-2956 PT Time Calculation (min): 34 min  PT Assessment / Plan / Recommendation History of Present Illness  R direct anterior THA  Clinical Impression  Pt s/p R THR presents with decreased R LE strength/ROM and post op pain limiting functional mobility.  Pt should progress well to d/c home with family assist and HHPT follow up.    PT Assessment  Patient needs continued PT services    Follow Up Recommendations  Home health PT    Does the patient have the potential to tolerate intense rehabilitation      Barriers to Discharge        Equipment Recommendations  None recommended by PT    Recommendations for Other Services OT consult   Frequency 7X/week    Precautions / Restrictions Precautions Precautions: None Restrictions Weight Bearing Restrictions: No Other Position/Activity Restrictions: WBAT   Pertinent Vitals/Pain 3/10; premed, ice pack provided      Mobility  Bed Mobility Bed Mobility: Supine to Sit Supine to Sit: 4: Min assist;With rails Details for Bed Mobility Assistance: cues for sequence and use of L LE to self assist Transfers Transfers: Sit to Stand;Stand to Sit Sit to Stand: 4: Min guard;With upper extremity assist;From chair/3-in-1 Stand to Sit: 4: Min assist;With upper extremity assist;To chair/3-in-1 Details for Transfer Assistance: min verbal cues for hand placement Ambulation/Gait Ambulation/Gait Assistance: 4: Min assist Ambulation Distance (Feet): 145 Feet Assistive device: Rolling walker Ambulation/Gait Assistance Details: cues for initial sequence, posture and position from RW Gait Pattern: Step-to pattern;Step-through pattern;Decreased step length - right;Decreased step length - left;Shuffle;Trunk flexed Stairs: No    Exercises Total Joint Exercises Ankle Circles/Pumps: AROM;10 reps;Supine;Both Quad Sets:  AROM;10 reps;Supine;Both Heel Slides: AAROM;15 reps;Supine;Right Hip ABduction/ADduction: AAROM;Right;15 reps;Supine   PT Diagnosis: Difficulty walking  PT Problem List: Decreased strength;Decreased range of motion;Decreased activity tolerance;Decreased mobility;Decreased knowledge of use of DME;Pain;Obesity PT Treatment Interventions: DME instruction;Gait training;Stair training;Functional mobility training;Therapeutic activities;Therapeutic exercise;Patient/family education     PT Goals(Current goals can be found in the care plan section) Acute Rehab PT Goals Patient Stated Goal: get up and move better PT Goal Formulation: With patient Time For Goal Achievement: 04/17/13 Potential to Achieve Goals: Good  Visit Information  Last PT Received On: 04/10/13 Assistance Needed: +1 History of Present Illness: R direct anterior THA       Prior Functioning  Home Living Family/patient expects to be discharged to:: Private residence Living Arrangements: Children Available Help at Discharge: Family Type of Home: House Home Access: Stairs to enter Secretary/administrator of Steps: 17 Entrance Stairs-Rails: Right;Left;Can reach both Home Layout: One level Home Equipment: Emergency planning/management officer - 4 wheels;Walker - 2 wheels;Bedside commode;Adaptive equipment Adaptive Equipment: Reacher Prior Function Level of Independence: Independent with assistive device(s) Communication Communication: No difficulties Dominant Hand: Right    Cognition  Cognition Arousal/Alertness: Awake/alert Behavior During Therapy: WFL for tasks assessed/performed Overall Cognitive Status: Within Functional Limits for tasks assessed    Extremity/Trunk Assessment Upper Extremity Assessment Upper Extremity Assessment: Overall WFL for tasks assessed Lower Extremity Assessment Lower Extremity Assessment: RLE deficits/detail RLE Deficits / Details: Hip strength 2/5 with AAROM at hip to 80 flex and 15 abd   Balance  Balance Balance Assessed: Yes Dynamic Standing Balance Dynamic Standing - Level of Assistance: 4: Min assist (min guard)  End of Session PT - End of Session Equipment Utilized During Treatment: Gait belt Activity Tolerance: Patient tolerated treatment well Patient  left: in chair;with call bell/phone within reach Nurse Communication: Mobility status  GP     Jachai Okazaki 04/10/2013, 10:21 AM

## 2013-04-11 DIAGNOSIS — D500 Iron deficiency anemia secondary to blood loss (chronic): Secondary | ICD-10-CM

## 2013-04-11 DIAGNOSIS — E669 Obesity, unspecified: Secondary | ICD-10-CM | POA: Diagnosis present

## 2013-04-11 HISTORY — DX: Obesity, unspecified: E66.9

## 2013-04-11 HISTORY — DX: Iron deficiency anemia secondary to blood loss (chronic): D50.0

## 2013-04-11 LAB — BASIC METABOLIC PANEL
BUN: 13 mg/dL (ref 6–23)
Calcium: 9.5 mg/dL (ref 8.4–10.5)
Chloride: 105 mEq/L (ref 96–112)
Creatinine, Ser: 0.56 mg/dL (ref 0.50–1.10)
GFR calc Af Amer: >90 mL/min (ref >90)
GFR calc non Af Amer: >90 mL/min (ref >90)
Glucose, Bld: 173 mg/dL — ABNORMAL HIGH (ref 70–99)

## 2013-04-11 LAB — CBC
HCT: 30.9 % — ABNORMAL LOW (ref 36.0–46.0)
MCHC: 34 g/dL (ref 30.0–36.0)
MCV: 82.4 fL (ref 78.0–100.0)
RDW: 13.8 % (ref 11.5–15.5)

## 2013-04-11 MED ORDER — ASPIRIN 325 MG PO TBEC: 325.0000 mg | DELAYED_RELEASE_TABLET | Freq: Two times a day (BID) | ORAL | Status: AC

## 2013-04-11 MED ORDER — HYDROCODONE-ACETAMINOPHEN 7.5-325 MG PO TABS: 1.0000 | ORAL_TABLET | ORAL | Status: DC | PRN

## 2013-04-11 MED ORDER — FERROUS SULFATE 325 (65 FE) MG PO TABS: 325.0000 mg | ORAL_TABLET | Freq: Three times a day (TID) | ORAL | Status: DC

## 2013-04-11 MED ORDER — DSS 100 MG PO CAPS: 100.0000 mg | ORAL_CAPSULE | Freq: Two times a day (BID) | ORAL | Status: DC

## 2013-04-11 MED ORDER — METHOCARBAMOL 500 MG PO TABS: 500.0000 mg | ORAL_TABLET | Freq: Four times a day (QID) | ORAL | Status: DC | PRN

## 2013-04-11 MED ORDER — POLYETHYLENE GLYCOL 3350 17 G PO PACK: 17.0000 g | PACK | Freq: Two times a day (BID) | ORAL | Status: DC

## 2013-04-11 NOTE — Progress Notes (Signed)
OT Cancellation Note  Patient Details Name: Rebecca Morse MRN: [id number] DOB: Oct 31, 1950   Cancelled Treatment:    Reason Eval/Treat Not Completed: Other (comment) Pt states she feels comfortable with all ADL for d/c. She states she has been up to bathroom 3in1 this am and feels comfortable. She also states she has assist with LB ADL at home and wont need AE. Pt expresses no further concerns.  Lennox Laity [phone] 04/11/2013, 8:53 AM

## 2013-04-11 NOTE — Progress Notes (Signed)
Physical Therapy Treatment Patient Details Name: Rebecca Morse MRN: [id number] DOB: Mar 10, 1951 Today's Date: 04/11/2013 Time: 1191-4782 PT Time Calculation (min): 23 min  PT Assessment / Plan / Recommendation  History of Present Illness     PT Comments     Follow Up Recommendations  Home health PT     Does the patient have the potential to tolerate intense rehabilitation     Barriers to Discharge        Equipment Recommendations  None recommended by PT    Recommendations for Other Services OT consult  Frequency 7X/week   Progress towards PT Goals Progress towards PT goals: Progressing toward goals  Plan Current plan remains appropriate    Precautions / Restrictions Precautions Precautions: None Restrictions Weight Bearing Restrictions: No Other Position/Activity Restrictions: WBAT   Pertinent Vitals/Pain 5/10; premed, ice pack provided    Mobility  Bed Mobility Bed Mobility: Sit to Supine Sit to Supine: 4: Min assist Details for Bed Mobility Assistance: Pt attempted roll into bed 2* back issues but unable to complete 2* pain.  Pt self assisted R LE with L LE and min assist Transfers Transfers: Sit to Stand;Stand to Sit Sit to Stand: 5: Supervision;From chair/3-in-1 Stand to Sit: 5: Supervision;To bed Details for Transfer Assistance: min verbal cues for hand placement Ambulation/Gait Ambulation/Gait Assistance: 4: Min guard Ambulation Distance (Feet): 5 Feet Assistive device: Rolling walker Ambulation/Gait Assistance Details: min cues for position from RW Gait Pattern: Step-to pattern;Decreased step length - right;Decreased step length - left;Shuffle Stairs: No    Exercises Total Joint Exercises Ankle Circles/Pumps: AROM;Supine;Both;20 reps Quad Sets: AROM;10 reps;Supine;Both Heel Slides: AAROM;15 reps;Supine;Right Hip ABduction/ADduction: AAROM;Right;15 reps;Supine   PT Diagnosis:    PT Problem List:   PT Treatment Interventions:     PT Goals  (current goals can now be found in the care plan section) Acute Rehab PT Goals Patient Stated Goal: get up and move better PT Goal Formulation: With patient Time For Goal Achievement: 04/17/13 Potential to Achieve Goals: Good  Visit Information  Last PT Received On: 04/11/13 Assistance Needed: +1    Subjective Data  Subjective: C/o ++ pain with attempt to roll into bed 2* back issues Patient Stated Goal: get up and move better   Cognition  Cognition Arousal/Alertness: Awake/alert Behavior During Therapy: WFL for tasks assessed/performed Overall Cognitive Status: Within Functional Limits for tasks assessed    Balance     End of Session PT - End of Session Activity Tolerance: Patient tolerated treatment well;Patient limited by pain Patient left: in bed;with call bell/phone within reach Nurse Communication: Mobility status   GP     Nuri Branca 04/11/2013, 12:13 PM

## 2013-04-11 NOTE — Discharge Summary (Signed)
Physician Discharge Summary  Patient ID: WYLMA TATEM MRN: [id number] DOB/AGE: 1950/07/07 62 y.o.  Admit date: 04/09/2013 Discharge date:  04/11/2013  Procedures:  Procedure(s) (LRB): RIGHT TOTAL HIP ARTHROPLASTY ANTERIOR APPROACH (Right)  Attending Physician:  Dr. Durene Romans   Admission Diagnoses:   Right hip OA / pain  Discharge Diagnoses:  Principal Problem:   S/P right THA, AA Active Problems:   Obese   Expected blood loss anemia  Past Medical History  Diagnosis Date  . Hypertension   . Anxiety   . Depression   . Arthritis     HPI: Rebecca Morse, 62 y.o. female, has a history of pain and functional disability in the right hip(s) due to arthritis and patient has failed non-surgical conservative treatments for greater than 12 weeks to include NSAID's and/or analgesics, corticosteriod injections, use of assistive devices and activity modification. Onset of symptoms was gradual starting 2.5 years ago with rapidlly worsening course since that time.The patient noted no past surgery on the right hip(s). Patient currently rates pain in the right hip at 8 out of 10 with activity. Patient has night pain, worsening of pain with activity and weight bearing, trendelenberg gait, pain that interfers with activities of daily living and pain with passive range of motion. Patient has evidence of periarticular osteophytes and joint space narrowing by imaging studies. This condition presents safety issues increasing the risk of falls. There is no current active infection. Risks, benefits and expectations were discussed with the patient. Patient understand the risks, benefits and expectations and wishes to proceed with surgery.   PCP: Nonnie Done., MD   Discharged Condition: good  Hospital Course:  Patient underwent the above stated procedure on 04/09/2013. Patient tolerated the procedure well and brought to the recovery room in good condition and subsequently to the floor.  POD  #1 BP: 142/66 ; Pulse: 83 ; Temp: 98.1 F (36.7 C)  Pt's foley was removed, as well as the hemovac drain removed. IV was changed to a saline lock. Patient reports pain as mild, pain well controlled. No events throughout the night. Neurovascular intact, dorsiflexion/plantar flexion intact, incision: dressing C/D/I, no cellulitis present and compartment soft.   LABS  Basename    HGB  11.2  HCT  32.1   POD #2  BP: 133/73 ; Pulse: 90 ; Temp: 98 F (36.7 C) ; Resp: 18  Patient reports pain as mild to moderate but progressing well, recognizes pain relief from surgery.  Ready to be discharged home. Neurovascular intact, dorsiflexion/plantar flexion intact, incision: dressing C/D/I, no cellulitis present and compartment soft.   LABS  Basename    HGB  10.5  HCT  30.9    Discharge Exam: General appearance: alert, cooperative and no distress Extremities: Homans sign is negative, no sign of DVT, no edema, redness or tenderness in the calves or thighs and no ulcers, gangrene or trophic changes  Disposition:  Home with follow up in 2 weeks   Follow-up Information   Follow up with Shelda Pal, MD. Schedule an appointment as soon as possible for a visit in 2 weeks.   Specialty:  Orthopedic Surgery   Contact information:   [address] [place] [address] [phone]       Discharge Orders   Future Orders Complete By Expires   Call MD / Call 911  As directed    Comments:     If you experience chest pain or shortness of breath, CALL 911 and be transported  to the hospital emergency room.  If you develope a fever above 101 F, pus (white drainage) or increased drainage or redness at the wound, or calf pain, call your surgeon's office.   Change dressing  As directed    Comments:     Maintain surgical dressing for 10-14 days, then replace with 4x4 guaze and tape. Keep the area dry and clean.   Constipation Prevention  As directed    Comments:     Drink plenty of  fluids.  Prune juice may be helpful.  You may use a stool softener, such as Colace (over the counter) 100 mg twice a day.  Use MiraLax (over the counter) for constipation as needed.   Diet - low sodium heart healthy  As directed    Discharge instructions  As directed    Comments:     Maintain surgical dressing for 10-14 days, then replace with gauze and tape. Keep the area dry and clean until follow up. Follow up in 2 weeks at [facility]. Call with any questions or concerns.   Driving restrictions  As directed    Comments:     No driving for 4 weeks   Increase activity slowly as tolerated  As directed    TED hose  As directed    Comments:     Use stockings (TED hose) for 2 weeks on both leg(s).  You may remove them at night for sleeping.   Weight bearing as tolerated  As directed    Questions:     Laterality:     Extremity:          Medication List    STOP taking these medications       HYDROcodone-acetaminophen 5-325 MG per tablet  Commonly known as:  NORCO/VICODIN  Replaced by:  HYDROcodone-acetaminophen 7.5-325 MG per tablet     nabumetone 750 MG tablet  Commonly known as:  RELAFEN      TAKE these medications       ALPRAZolam 0.5 MG tablet  Commonly known as:  XANAX  Take 0.5 mg by mouth 3 (three) times daily as needed for sleep.     amLODipine 10 MG tablet  Commonly known as:  NORVASC  Take 10 mg by mouth daily.     aspirin 325 MG EC tablet  Take 1 tablet (325 mg total) by mouth 2 (two) times daily.     atenolol 50 MG tablet  Commonly known as:  TENORMIN  Take 50 mg by mouth daily.     buPROPion 150 MG 12 hr tablet  Commonly known as:  WELLBUTRIN SR  Take 150 mg by mouth 2 (two) times daily.     DSS 100 MG Caps  Take 100 mg by mouth 2 (two) times daily.     ferrous sulfate 325 (65 FE) MG tablet  Take 1 tablet (325 mg total) by mouth 3 (three) times daily after meals.     HYDROcodone-acetaminophen 7.5-325 MG per tablet  Commonly known as:   NORCO  Take 1-2 tablets by mouth every 4 (four) hours as needed for moderate pain.     lisinopril 20 MG tablet  Commonly known as:  PRINIVIL,ZESTRIL  Take 20 mg by mouth daily.     methocarbamol 500 MG tablet  Commonly known as:  ROBAXIN  Take 1 tablet (500 mg total) by mouth every 6 (six) hours as needed for muscle spasms.     multivitamin tablet  Take 1 tablet by mouth daily.  polyethylene glycol packet  Commonly known as:  MIRALAX / GLYCOLAX  Take 17 g by mouth 2 (two) times daily.     VITAMIN C PO  Take 1 tablet by mouth daily.         Signed: Anastasio Auerbach. Kamorah Nevils   PAC  04/11/2013, 12:10 PM

## 2013-04-11 NOTE — Progress Notes (Signed)
Physical Therapy Treatment Patient Details Name: Rebecca Morse MRN: [id number] DOB: 1950/11/18 Today's Date: 04/11/2013 Time: 9528-4132 PT Time Calculation (min): 27 min  PT Assessment / Plan / Recommendation  History of Present Illness     PT Comments   Reviewed stairs and car transfers with pt.  Follow Up Recommendations  Home health PT     Does the patient have the potential to tolerate intense rehabilitation     Barriers to Discharge        Equipment Recommendations  None recommended by PT    Recommendations for Other Services OT consult  Frequency 7X/week   Progress towards PT Goals Progress towards PT goals: Progressing toward goals  Plan Current plan remains appropriate    Precautions / Restrictions Precautions Precautions: None Restrictions Weight Bearing Restrictions: No Other Position/Activity Restrictions: WBAT   Pertinent Vitals/Pain 3/10; premed, ice pack provided    Mobility  Bed Mobility Bed Mobility: Sit to Supine Sit to Supine: 4: Min assist Details for Bed Mobility Assistance: Pt attempted roll into bed 2* back issues but unable to complete 2* pain.  Pt self assisted R LE with L LE and min assist Transfers Transfers: Sit to Stand;Stand to Sit Sit to Stand: 5: Supervision;From chair/3-in-1 Stand to Sit: 5: Supervision;To chair/3-in-1 Details for Transfer Assistance: min verbal cues for hand placement Ambulation/Gait Ambulation/Gait Assistance: 4: Min guard;5: Supervision Ambulation Distance (Feet): 150 Feet Assistive device: Rolling walker Ambulation/Gait Assistance Details: min cues for position from RW Gait Pattern: Step-to pattern;Step-through pattern;Shuffle;Trunk flexed Stairs: Yes Stairs Assistance: 4: Min assist Stairs Assistance Details (indicate cue type and reason): cues for sequence and foot/crutch placment Stair Management Technique: One rail Right;Forwards;With crutches Number of Stairs: 10    Exercises Total Joint  Exercises Ankle Circles/Pumps: AROM;Supine;Both;20 reps Quad Sets: AROM;10 reps;Supine;Both Heel Slides: AAROM;15 reps;Supine;Right Hip ABduction/ADduction: AAROM;Right;15 reps;Supine   PT Diagnosis:    PT Problem List:   PT Treatment Interventions:     PT Goals (current goals can now be found in the care plan section) Acute Rehab PT Goals Patient Stated Goal: get up and move better PT Goal Formulation: With patient Time For Goal Achievement: 04/17/13 Potential to Achieve Goals: Good  Visit Information  Last PT Received On: 04/11/13 Assistance Needed: +1    Subjective Data  Subjective: C/o ++ pain with attempt to roll into bed 2* back issues Patient Stated Goal: get up and move better   Cognition  Cognition Arousal/Alertness: Awake/alert Behavior During Therapy: WFL for tasks assessed/performed Overall Cognitive Status: Within Functional Limits for tasks assessed    Balance     End of Session PT - End of Session Equipment Utilized During Treatment: Gait belt Activity Tolerance: Patient tolerated treatment well Patient left: in chair;with call bell/phone within reach Nurse Communication: Mobility status   GP     [name] 04/11/2013, 12:17 PM

## 2013-04-11 NOTE — Progress Notes (Signed)
Patient ID: Rebecca Morse, female   DOB: [birth year], 62 y.o.   MRN: [id number] Subjective: 2 Days Post-Op Procedure(s) (LRB): RIGHT TOTAL HIP ARTHROPLASTY ANTERIOR APPROACH (Right)    Patient reports pain as mild to moderate but progressing well, recognizes pain relief from surgery  Objective:   VITALS:   Filed Vitals:   04/11/13 0455  BP: 133/73  Pulse: 90  Temp: 98 F (36.7 C)  Resp: 18    Neurovascular intact Incision: dressing C/D/I  LABS  Recent Labs  04/10/13 0423 04/11/13 0410  HGB 11.2* 10.5*  HCT 32.1* 30.9*  WBC 13.3* 15.7*  PLT 245 243     Recent Labs  04/10/13 0423 04/11/13 0410  NA 140 140  K 4.2 4.0  BUN 11 13  CREATININE 0.60 0.56  GLUCOSE 134* 173*    No results found for this basename: LABPT, INR,  in the last 72 hours   Assessment/Plan: 2 Days Post-Op Procedure(s) (LRB): RIGHT TOTAL HIP ARTHROPLASTY ANTERIOR APPROACH (Right)   Up with therapy Discharge home with home health today after therapy

## 2013-04-11 NOTE — Care Management Note (Signed)
    Page 1 of 1   04/11/2013     11:06:33 AM   CARE MANAGEMENT NOTE 04/11/2013  Patient:  HALI, BALGOBIN   Account Number:  [id number]  Date Initiated:  04/11/2013  Documentation initiated by:  Colleen Can  Subjective/Objective Assessment:   Dx: Osteoarthritis rt hip; total hip replacemnt-anterior approach     Action/Plan:   CM spoke with patient. Plans are for patient to return to her home in [place] where daughters will be caregivers. She already has RW and commode. Genevieve Norlander will provide [facility] services.   Anticipated DC Date:  04/11/2013   Anticipated DC Plan:  HOME W HOME HEALTH SERVICES      DC Planning Services  CM consult      [facility] Choice  HOME HEALTH   Choice offered to / List presented to:  C-1 Patient        HH arranged  HH-2 PT      [facility] agency  [facility]   Status of service:  Completed, signed off Medicare Important Message given?  NA - LOS <3 / Initial given by admissions (If response is "NO", the following Medicare IM given date fields will be blank) Date Medicare IM given:   Date Additional Medicare IM given:    Discharge Disposition:  HOME W HOME HEALTH SERVICES  Per UR Regulation:    If discussed at Long Length of Stay Meetings, dates discussed:    Comments:  04/11/2013 Colleen Can BSN RN CCM [phone] Pt for discharge. Genevieve Norlander will provide [facility] services with start date of tomorrow.

## 2014-06-21 IMAGING — CR DG PORTABLE PELVIS
1 series · 1 of 1 positions shown · non-contrast
Comparison: None.

CLINICAL DATA: Right hip replacement.

EXAM:
PORTABLE PELVIS 1-2 VIEWS

[AP]
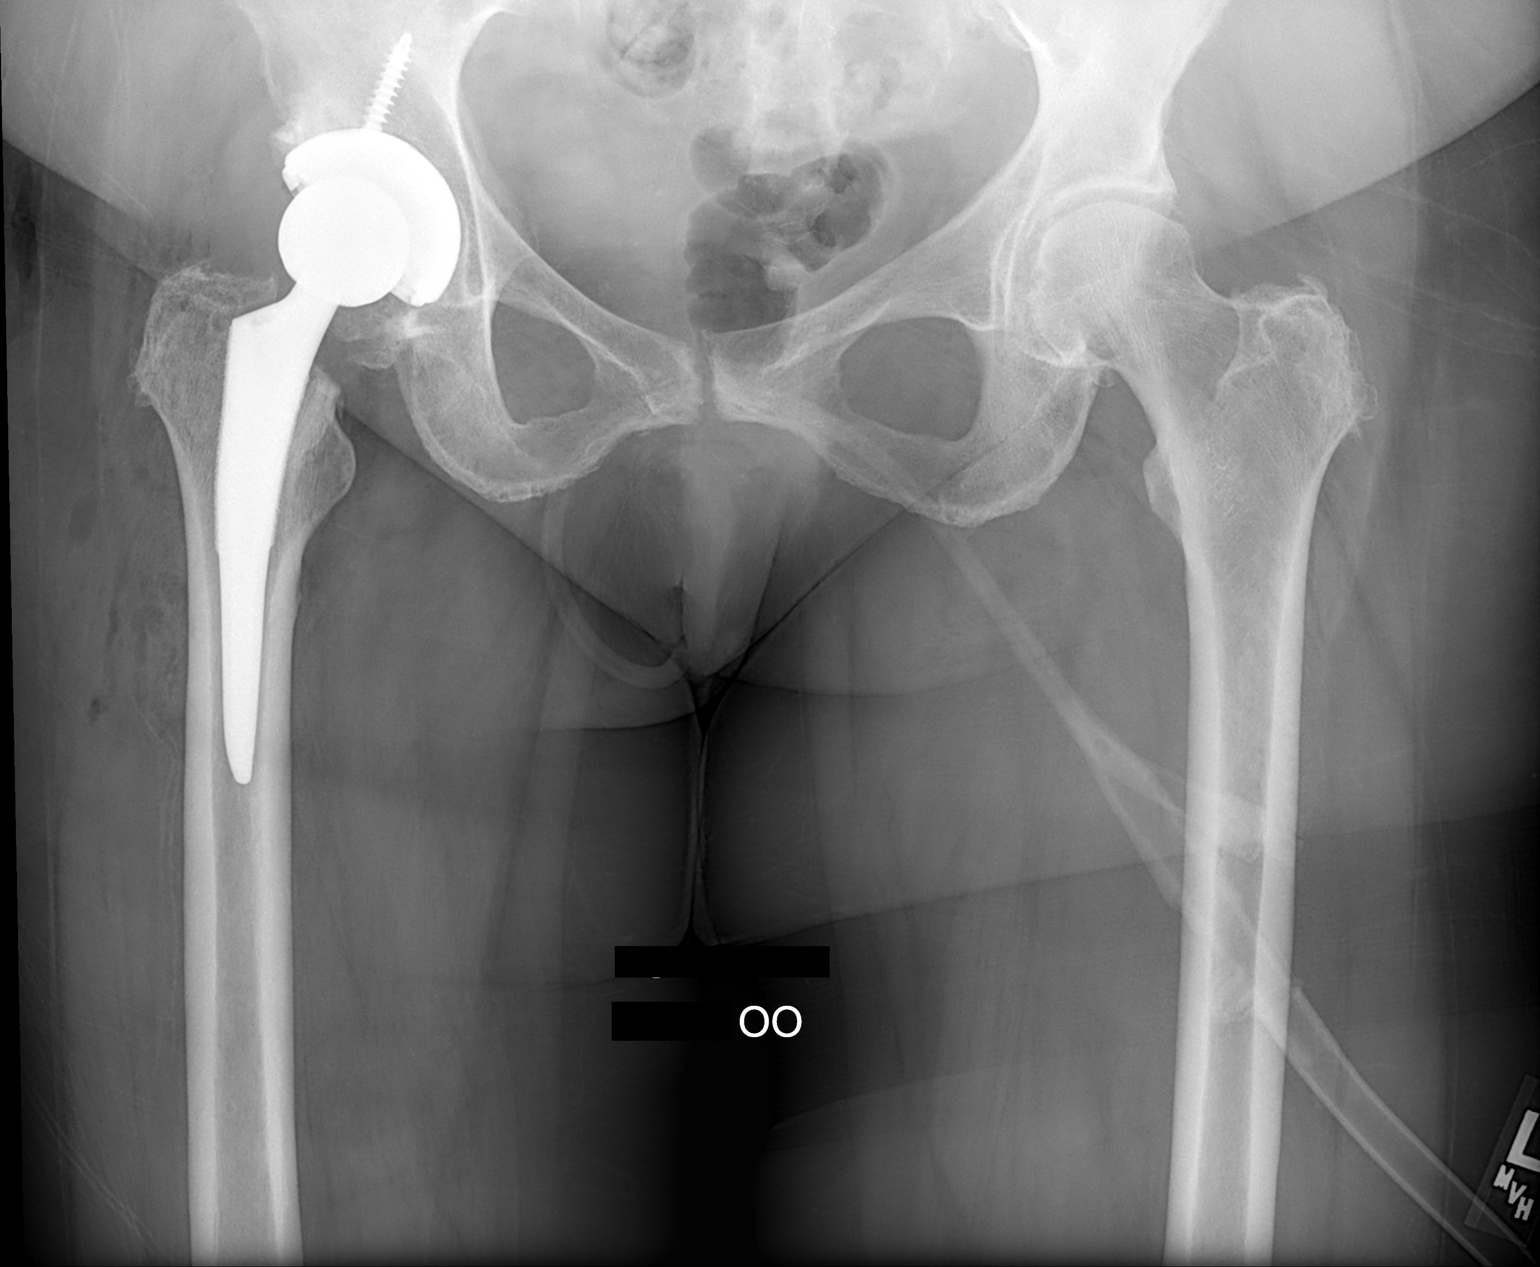

[1 of 1 positions shown; findings below may reference images not displayed]

FINDINGS: Patient status post total right hip replacement. Good anatomic
alignment on AP view noted. No acute bony abnormality. Subcutaneous
air noted consistent with prior surgery Degenerative changes left
hip. A Foley catheter.
IMPRESSION: Patient is status post total right hip replacement with good
anatomic alignment on AP view.

## 2015-07-22 NOTE — H&P (Signed)
Rebecca Morse is an 65 y.o. female.    Chief Complaint: left shoulder pain  HPI: Pt is a 65 y.o. female complaining of left shoulder pain for multiple years. Pain had continually increased since the beginning. X-rays in the clinic show end-stage arthritic changes of the left shoulder. Pt has tried various conservative treatments which have failed to alleviate their symptoms, including injections and therapy. Various options are discussed with the patient. Risks, benefits and expectations were discussed with the patient. Patient understand the risks, benefits and expectations and wishes to proceed with surgery.   PCP:  Nonnie Done., MD  D/C Plans: Home  PMH: Past Medical History  Diagnosis Date  . Hypertension   . Anxiety   . Depression   . Arthritis     PSH: Past Surgical History  Procedure Laterality Date  . Back surgery    . Tonsillectomy    . Abdominal hysterectomy    . Tubal ligation    . Cholecystectomy    . Appendectomy    . Total hip arthroplasty Right 04/09/2013    Procedure: RIGHT TOTAL HIP ARTHROPLASTY ANTERIOR APPROACH;  Surgeon: Shelda Pal, MD;  Location: [facility] ORS;  Service: Orthopedics;  Laterality: Right;    Social History:  reports that she quit smoking about 17 years ago. She does not have any smokeless tobacco history on file. She reports that she drinks alcohol. She reports that she does not use illicit drugs.  Allergies:  Allergies  Allergen Reactions  . Ancef [Cefazolin] Hives, Rash and Other (See Comments)    Allergic reaction post infusion had increased peak pressure, red rash,, and hives    Medications: No current facility-administered medications for this encounter.   Current Outpatient Prescriptions  Medication Sig Dispense Refill  . ALPRAZolam (XANAX) 0.5 MG tablet Take 0.5 mg by mouth 3 (three) times daily as needed for sleep.    Marland Kitchen amLODipine (NORVASC) 10 MG tablet Take 10 mg by mouth daily.    . Ascorbic Acid (VITAMIN C PO) Take 1  tablet by mouth daily.    Marland Kitchen atenolol (TENORMIN) 50 MG tablet Take 50 mg by mouth daily.    Marland Kitchen buPROPion (WELLBUTRIN SR) 150 MG 12 hr tablet Take 150 mg by mouth 2 (two) times daily.    Marland Kitchen docusate sodium 100 MG CAPS Take 100 mg by mouth 2 (two) times daily. 10 capsule 0  . ferrous sulfate 325 (65 FE) MG tablet Take 1 tablet (325 mg total) by mouth 3 (three) times daily after meals.  3  . HYDROcodone-acetaminophen (NORCO) 7.5-325 MG per tablet Take 1-2 tablets by mouth every 4 (four) hours as needed for moderate pain. 100 tablet 0  . lisinopril (PRINIVIL,ZESTRIL) 20 MG tablet Take 20 mg by mouth daily.    . methocarbamol (ROBAXIN) 500 MG tablet Take 1 tablet (500 mg total) by mouth every 6 (six) hours as needed for muscle spasms. 50 tablet 0  . Multiple Vitamin (MULTIVITAMIN) tablet Take 1 tablet by mouth daily.    . polyethylene glycol (MIRALAX / GLYCOLAX) packet Take 17 g by mouth 2 (two) times daily. 14 each 0    No results found for this or any previous visit (from the past 48 hour(s)). No results found.  ROS: Pain with rom of the left upper extremity  Physical Exam:  Alert and oriented 65 y.o. female in no acute distress Cranial nerves 2-12 intact Cervical spine: full rom with no tenderness, nv intact distally Chest: active breath sounds bilaterally, no  wheeze rhonchi or rales Heart: regular rate and rhythm, no murmur Abd: non tender non distended with active bowel sounds Hip is stable with rom  Limited rom of left shoulder due to pain and guarding Strength ER and IR 4.5/5 as compared to right nv intact distally No rashes or edema  Assessment/Plan Assessment: left shoulder end stage osteoarthritis  Plan: Patient will undergo a left total shoulder arthroplasty by Dr. Ranell Patrick at [facility]. Risks benefits and expectations were discussed with the patient. Patient understand risks, benefits and expectations and wishes to proceed.

## 2015-07-28 ENCOUNTER — Encounter (HOSPITAL_COMMUNITY)
Admission: RE | Admit: 2015-07-28 | Discharge: 2015-07-28 | Disposition: A | Discharge status: Home / Self Care | Payer: Medicare Other | Source: Ambulatory Visit | Attending: Orthopedic Surgery | Admitting: Orthopedic Surgery

## 2015-07-28 ENCOUNTER — Encounter (HOSPITAL_COMMUNITY): Payer: Self-pay

## 2015-07-28 HISTORY — DX: Headache: R51

## 2015-07-28 HISTORY — DX: Other specified postprocedural states: Z98.890

## 2015-07-28 HISTORY — DX: Headache, unspecified: R51.9

## 2015-07-28 HISTORY — DX: Other specified postprocedural states: R11.2

## 2015-07-28 HISTORY — DX: Fibromyalgia: M79.7

## 2015-07-28 HISTORY — DX: Adverse effect of unspecified anesthetic, initial encounter: T41.45XA

## 2015-07-28 HISTORY — DX: Other complications of anesthesia, initial encounter: T88.59XA

## 2015-07-28 HISTORY — DX: Respiratory tuberculosis unspecified: A15.9

## 2015-07-28 LAB — SURGICAL PCR SCREEN
MRSA, PCR: NEGATIVE
Staphylococcus aureus: NEGATIVE

## 2015-07-28 LAB — CBC
HCT: 41.6 % (ref 36.0–46.0)
Hemoglobin: 13.7 g/dL (ref 12.0–15.0)
MCH: 27.6 pg (ref 26.0–34.0)
MCHC: 32.9 g/dL (ref 30.0–36.0)
MCV: 83.7 fL (ref 78.0–100.0)
PLATELETS: 304 10*3/uL (ref 150–400)
RBC: 4.97 MIL/uL (ref 3.87–5.11)
RDW: 13.2 % (ref 11.5–15.5)
WBC: 9.9 10*3/uL (ref 4.0–10.5)

## 2015-07-28 LAB — BASIC METABOLIC PANEL
Anion gap: 10 (ref 5–15)
BUN: 11 mg/dL (ref 6–20)
CALCIUM: 9.8 mg/dL (ref 8.9–10.3)
CO2: 25 mmol/L (ref 22–32)
CREATININE: 0.63 mg/dL (ref 0.44–1.00)
Chloride: 105 mmol/L (ref 101–111)
GFR calc Af Amer: >60 mL/min (ref >60)
GFR calc non Af Amer: >60 mL/min (ref >60)
GLUCOSE: 92 mg/dL (ref 65–99)
Potassium: 4.2 mmol/L (ref 3.5–5.1)
Sodium: 140 mmol/L (ref 135–145)

## 2015-07-28 NOTE — Pre-Procedure Instructions (Addendum)
ADRIA COSTLEY  07/28/2015      [facility] - [place] - [address] [place] [address] Phone: [phone] Fax: [phone]    Your procedure is scheduled on 07/31/15  Report to [facility] Admitting at 1130 A.M.  Call this number if you have problems the morning of surgery:  [phone]   Remember:  Do not eat food or drink liquids after midnight.  Take these medicines the morning of surgery with A SIP OF WATER xanax if needed,amlodipine, atenolol,cymbalta  STOP all herbel meds, nsaids (aleve,naproxen,advil,ibuprofen)  prior to surgery starting Today including vitamins(C, multi, probiotic, coconut oil caps), aspirin   Do not wear jewelry, make-up or nail polish.  Do not wear lotions, powders, or perfumes.  You may wear deodorant.  Do not shave 48 hours prior to surgery.  Men may shave face and neck.  Do not bring valuables to the hospital.  [facility] is not responsible for any belongings or valuables.  Contacts, dentures or bridgework may not be worn into surgery.  Leave your suitcase in the car.  After surgery it may be brought to your room.  For patients admitted to the hospital, discharge time will be determined by your treatment team.  Patients discharged the day of surgery will not be allowed to drive home.   Name and phone number of your driver:    Special instructions:   Special Instructions: [place] - Preparing for Surgery  Before surgery, you can play an important role.  Because skin is not sterile, your skin needs to be as free of germs as possible.  You can reduce the number of germs on you skin by washing with CHG (chlorahexidine gluconate) soap before surgery.  CHG is an antiseptic cleaner which kills germs and bonds with the skin to continue killing germs even after washing.  Please DO NOT use if you have an allergy to CHG or antibacterial soaps.  If your skin becomes reddened/irritated stop using the  CHG and inform your nurse when you arrive at Short Stay.  Do not shave (including legs and underarms) for at least 48 hours prior to the first CHG shower.  You may shave your face.  Please follow these instructions carefully:   1.  Shower with CHG Soap the night before surgery and the morning of Surgery.  2.  If you choose to wash your hair, wash your hair first as usual with your normal shampoo.  3.  After you shampoo, rinse your hair and body thoroughly to remove the Shampoo.  4.  Use CHG as you would any other liquid soap.  You can apply chg directly  to the skin and wash gently with scrungie or a clean washcloth.  5.  Apply the CHG Soap to your body ONLY FROM THE NECK DOWN.  Do not use on open wounds or open sores.  Avoid contact with your eyes ears, mouth and genitals (private parts).  Wash genitals (private parts)       with your normal soap.  6.  Wash thoroughly, paying special attention to the area where your surgery will be performed.  7.  Thoroughly rinse your body with warm water from the neck down.  8.  DO NOT shower/wash with your normal soap after using and rinsing off the CHG Soap.  9.  Pat yourself dry with a clean towel.            10.  Wear clean pajamas.            11.  Place clean sheets on your bed the night of your first shower and do not sleep with pets.  Day of Surgery  Do not apply any lotions/deodorants the morning of surgery.  Please wear clean clothes to the hospital/surgery center.  Please read over the following fact sheets that you were given. Pain Booklet, Coughing and Deep Breathing, MRSA Information and Surgical Site Infection Prevention

## 2015-07-30 MED ORDER — CHLORHEXIDINE GLUCONATE 4 % EX LIQD: 60.0000 mL | CUTANEOUS | Status: DC

## 2015-07-30 MED ORDER — CLINDAMYCIN PHOSPHATE 900 MG/50ML IV SOLN
900.0000 mg | INTRAVENOUS | Status: AC
  Administered 2015-07-31: 900 mg via INTRAVENOUS
  Filled 2015-07-30: qty 50

## 2015-07-31 ENCOUNTER — Inpatient Hospital Stay (HOSPITAL_COMMUNITY): Payer: Medicare Other

## 2015-07-31 ENCOUNTER — Encounter (HOSPITAL_COMMUNITY): Payer: Self-pay

## 2015-07-31 ENCOUNTER — Inpatient Hospital Stay (HOSPITAL_COMMUNITY)
Admission: RE | Admit: 2015-07-31 | Discharge: 2015-08-02 | DRG: 483 | Disposition: A | Discharge status: Home Health | Payer: Medicare Other | Source: Ambulatory Visit | Attending: Orthopedic Surgery | Admitting: Orthopedic Surgery

## 2015-07-31 ENCOUNTER — Inpatient Hospital Stay (HOSPITAL_COMMUNITY): Payer: Medicare Other | Admitting: Anesthesiology

## 2015-07-31 ENCOUNTER — Encounter (HOSPITAL_COMMUNITY)
Admission: RE | Disposition: A | Discharge status: Home Health | Payer: Self-pay | Source: Ambulatory Visit | Attending: Orthopedic Surgery

## 2015-07-31 DIAGNOSIS — M25512 Pain in left shoulder: Secondary | ICD-10-CM | POA: Diagnosis present

## 2015-07-31 DIAGNOSIS — M797 Fibromyalgia: Secondary | ICD-10-CM | POA: Diagnosis present

## 2015-07-31 DIAGNOSIS — F329 Major depressive disorder, single episode, unspecified: Secondary | ICD-10-CM | POA: Diagnosis present

## 2015-07-31 DIAGNOSIS — Z96641 Presence of right artificial hip joint: Secondary | ICD-10-CM | POA: Diagnosis present

## 2015-07-31 DIAGNOSIS — Z87891 Personal history of nicotine dependence: Secondary | ICD-10-CM

## 2015-07-31 DIAGNOSIS — F419 Anxiety disorder, unspecified: Secondary | ICD-10-CM | POA: Diagnosis present

## 2015-07-31 DIAGNOSIS — M19012 Primary osteoarthritis, left shoulder: Secondary | ICD-10-CM | POA: Diagnosis present

## 2015-07-31 DIAGNOSIS — Z96619 Presence of unspecified artificial shoulder joint: Secondary | ICD-10-CM

## 2015-07-31 DIAGNOSIS — Z96612 Presence of left artificial shoulder joint: Secondary | ICD-10-CM

## 2015-07-31 DIAGNOSIS — I10 Essential (primary) hypertension: Secondary | ICD-10-CM | POA: Diagnosis present

## 2015-07-31 DIAGNOSIS — M25712 Osteophyte, left shoulder: Secondary | ICD-10-CM | POA: Diagnosis present

## 2015-07-31 HISTORY — DX: Presence of unspecified artificial shoulder joint: Z96.619

## 2015-07-31 HISTORY — PX: TOTAL SHOULDER ARTHROPLASTY: SHX126

## 2015-07-31 SURGERY — ARTHROPLASTY, SHOULDER, TOTAL
Anesthesia: General | Laterality: Left

## 2015-07-31 MED ORDER — LACTATED RINGERS IV SOLN: INTRAVENOUS | Status: DC

## 2015-07-31 MED ORDER — MIDAZOLAM HCL 2 MG/2ML IJ SOLN
INTRAMUSCULAR | Status: AC
  Administered 2015-07-31: 2 mg
  Filled 2015-07-31: qty 2

## 2015-07-31 MED ORDER — METHOCARBAMOL 500 MG PO TABS: 500.0000 mg | ORAL_TABLET | Freq: Three times a day (TID) | ORAL | Status: DC | PRN

## 2015-07-31 MED ORDER — ATENOLOL 50 MG PO TABS
50.0000 mg | ORAL_TABLET | Freq: Every day | ORAL | Status: DC
  Administered 2015-08-01 – 2015-08-02 (×2): 50 mg via ORAL
  Filled 2015-07-31 (×2): qty 1

## 2015-07-31 MED ORDER — DULOXETINE HCL 30 MG PO CPEP
30.0000 mg | ORAL_CAPSULE | Freq: Two times a day (BID) | ORAL | Status: DC
  Administered 2015-07-31 – 2015-08-02 (×4): 30 mg via ORAL
  Filled 2015-07-31 (×4): qty 1

## 2015-07-31 MED ORDER — MORPHINE SULFATE (PF) 2 MG/ML IV SOLN
2.0000 mg | INTRAVENOUS | Status: DC | PRN
  Administered 2015-07-31 (×2): 2 mg via INTRAVENOUS
  Administered 2015-08-01: 4 mg via INTRAVENOUS
  Administered 2015-08-01: 2 mg via INTRAVENOUS
  Filled 2015-07-31: qty 2
  Filled 2015-07-31 (×3): qty 1

## 2015-07-31 MED ORDER — METHOCARBAMOL 500 MG PO TABS
500.0000 mg | ORAL_TABLET | Freq: Four times a day (QID) | ORAL | Status: DC | PRN
  Administered 2015-07-31 – 2015-08-02 (×3): 500 mg via ORAL
  Filled 2015-07-31 (×2): qty 1

## 2015-07-31 MED ORDER — EPHEDRINE SULFATE 50 MG/ML IJ SOLN
INTRAMUSCULAR | Status: DC | PRN
  Administered 2015-07-31 (×2): 10 mg via INTRAVENOUS

## 2015-07-31 MED ORDER — ALPRAZOLAM 0.5 MG PO TABS: 0.5000 mg | ORAL_TABLET | Freq: Three times a day (TID) | ORAL | Status: DC | PRN

## 2015-07-31 MED ORDER — FENTANYL CITRATE (PF) 250 MCG/5ML IJ SOLN
INTRAMUSCULAR | Status: AC
  Filled 2015-07-31: qty 5

## 2015-07-31 MED ORDER — OXYCODONE-ACETAMINOPHEN 5-325 MG PO TABS: 1.0000 | ORAL_TABLET | ORAL | Status: DC | PRN

## 2015-07-31 MED ORDER — COCONUT OIL 1000 MG PO CAPS: 1000.0000 mg | ORAL_CAPSULE | Freq: Every day | ORAL | Status: DC

## 2015-07-31 MED ORDER — ACETAMINOPHEN 325 MG PO TABS
650.0000 mg | ORAL_TABLET | Freq: Four times a day (QID) | ORAL | Status: DC | PRN
  Administered 2015-08-02: 650 mg via ORAL
  Filled 2015-07-31: qty 2

## 2015-07-31 MED ORDER — ONDANSETRON HCL 4 MG PO TABS: 4.0000 mg | ORAL_TABLET | Freq: Four times a day (QID) | ORAL | Status: DC | PRN

## 2015-07-31 MED ORDER — DOCUSATE SODIUM 100 MG PO CAPS
100.0000 mg | ORAL_CAPSULE | Freq: Two times a day (BID) | ORAL | Status: DC
  Administered 2015-07-31 – 2015-08-02 (×4): 100 mg via ORAL
  Filled 2015-07-31 (×5): qty 1

## 2015-07-31 MED ORDER — AMLODIPINE BESYLATE 10 MG PO TABS
10.0000 mg | ORAL_TABLET | Freq: Every day | ORAL | Status: DC
  Administered 2015-08-01 – 2015-08-02 (×2): 10 mg via ORAL
  Filled 2015-07-31 (×2): qty 1

## 2015-07-31 MED ORDER — FENTANYL CITRATE (PF) 250 MCG/5ML IJ SOLN
INTRAMUSCULAR | Status: DC | PRN
  Administered 2015-07-31: 50 ug via INTRAVENOUS

## 2015-07-31 MED ORDER — THROMBIN 20000 UNITS EX SOLR
CUTANEOUS | Status: DC | PRN
  Administered 2015-07-31: 20 mL via TOPICAL

## 2015-07-31 MED ORDER — GLYCOPYRROLATE 0.2 MG/ML IJ SOLN
INTRAMUSCULAR | Status: DC | PRN
  Administered 2015-07-31: 0.4 mg via INTRAVENOUS

## 2015-07-31 MED ORDER — BUPIVACAINE-EPINEPHRINE (PF) 0.25% -1:200000 IJ SOLN
INTRAMUSCULAR | Status: AC
  Filled 2015-07-31: qty 30

## 2015-07-31 MED ORDER — OXYCODONE HCL 5 MG PO TABS
5.0000 mg | ORAL_TABLET | ORAL | Status: DC | PRN
  Administered 2015-07-31 – 2015-08-02 (×4): 10 mg via ORAL
  Filled 2015-07-31 (×4): qty 2

## 2015-07-31 MED ORDER — PHENYLEPHRINE HCL 10 MG/ML IJ SOLN
10.0000 mg | INTRAVENOUS | Status: DC | PRN
  Administered 2015-07-31: 20 ug/min via INTRAVENOUS

## 2015-07-31 MED ORDER — PHENOL 1.4 % MT LIQD: 1.0000 | OROMUCOSAL | Status: DC | PRN

## 2015-07-31 MED ORDER — THROMBIN 20000 UNITS EX SOLR
CUTANEOUS | Status: AC
  Filled 2015-07-31: qty 20000

## 2015-07-31 MED ORDER — ONDANSETRON HCL 4 MG/2ML IJ SOLN
INTRAMUSCULAR | Status: DC | PRN
  Administered 2015-07-31: 4 mg via INTRAVENOUS

## 2015-07-31 MED ORDER — CLINDAMYCIN PHOSPHATE 600 MG/50ML IV SOLN
600.0000 mg | Freq: Four times a day (QID) | INTRAVENOUS | Status: AC
Start: 2015-07-31 — End: 2015-08-01
  Administered 2015-07-31 – 2015-08-01 (×3): 600 mg via INTRAVENOUS
  Filled 2015-07-31 (×3): qty 50

## 2015-07-31 MED ORDER — NEOSTIGMINE METHYLSULFATE 10 MG/10ML IV SOLN
INTRAVENOUS | Status: DC | PRN
  Administered 2015-07-31: 3 mg via INTRAVENOUS

## 2015-07-31 MED ORDER — FENTANYL CITRATE (PF) 100 MCG/2ML IJ SOLN
INTRAMUSCULAR | Status: AC
  Filled 2015-07-31: qty 2

## 2015-07-31 MED ORDER — PROPOFOL 10 MG/ML IV BOLUS
INTRAVENOUS | Status: DC | PRN
  Administered 2015-07-31: 140 mg via INTRAVENOUS

## 2015-07-31 MED ORDER — BUPIVACAINE-EPINEPHRINE (PF) 0.5% -1:200000 IJ SOLN
INTRAMUSCULAR | Status: DC | PRN
  Administered 2015-07-31: 15 mL via PERINEURAL

## 2015-07-31 MED ORDER — FENTANYL CITRATE (PF) 100 MCG/2ML IJ SOLN
25.0000 ug | INTRAMUSCULAR | Status: DC | PRN
  Administered 2015-07-31 (×2): 50 ug via INTRAVENOUS

## 2015-07-31 MED ORDER — LACTATED RINGERS IV SOLN
INTRAVENOUS | Status: DC
  Administered 2015-07-31 (×2): via INTRAVENOUS

## 2015-07-31 MED ORDER — ACETAMINOPHEN 650 MG RE SUPP: 650.0000 mg | Freq: Four times a day (QID) | RECTAL | Status: DC | PRN

## 2015-07-31 MED ORDER — METOCLOPRAMIDE HCL 5 MG/ML IJ SOLN: 5.0000 mg | Freq: Three times a day (TID) | INTRAMUSCULAR | Status: DC | PRN

## 2015-07-31 MED ORDER — 0.9 % SODIUM CHLORIDE (POUR BTL) OPTIME
TOPICAL | Status: DC | PRN
  Administered 2015-07-31: 1000 mL

## 2015-07-31 MED ORDER — LIDOCAINE HCL 4 % EX SOLN
CUTANEOUS | Status: DC | PRN
  Administered 2015-07-31: 4 mL via TOPICAL

## 2015-07-31 MED ORDER — FENTANYL CITRATE (PF) 100 MCG/2ML IJ SOLN
INTRAMUSCULAR | Status: AC
  Administered 2015-07-31: 50 ug
  Filled 2015-07-31: qty 2

## 2015-07-31 MED ORDER — ONDANSETRON HCL 4 MG/2ML IJ SOLN
4.0000 mg | Freq: Four times a day (QID) | INTRAMUSCULAR | Status: DC | PRN
  Administered 2015-08-01: 4 mg via INTRAVENOUS
  Filled 2015-07-31: qty 2

## 2015-07-31 MED ORDER — METHOCARBAMOL 500 MG PO TABS
ORAL_TABLET | ORAL | Status: AC
  Filled 2015-07-31: qty 1

## 2015-07-31 MED ORDER — LISINOPRIL 20 MG PO TABS
20.0000 mg | ORAL_TABLET | Freq: Every day | ORAL | Status: DC
Start: 2015-08-01 — End: 2015-08-02
  Administered 2015-08-01 – 2015-08-02 (×2): 20 mg via ORAL
  Filled 2015-07-31 (×2): qty 1

## 2015-07-31 MED ORDER — PROPOFOL 10 MG/ML IV BOLUS
INTRAVENOUS | Status: AC
  Filled 2015-07-31: qty 20

## 2015-07-31 MED ORDER — METOCLOPRAMIDE HCL 5 MG PO TABS: 5.0000 mg | ORAL_TABLET | Freq: Three times a day (TID) | ORAL | Status: DC | PRN

## 2015-07-31 MED ORDER — ROCURONIUM BROMIDE 100 MG/10ML IV SOLN
INTRAVENOUS | Status: DC | PRN
  Administered 2015-07-31: 35 mg via INTRAVENOUS

## 2015-07-31 MED ORDER — BUPIVACAINE-EPINEPHRINE 0.25% -1:200000 IJ SOLN
INTRAMUSCULAR | Status: DC | PRN
  Administered 2015-07-31: 10 mL

## 2015-07-31 MED ORDER — ADULT MULTIVITAMIN W/MINERALS CH
1.0000 | ORAL_TABLET | Freq: Every day | ORAL | Status: DC
  Administered 2015-08-01 – 2015-08-02 (×2): 1 via ORAL
  Filled 2015-07-31 (×2): qty 1

## 2015-07-31 MED ORDER — VITAMIN C 500 MG/5ML PO SYRP
1000.0000 mg | ORAL_SOLUTION | Freq: Every day | ORAL | Status: DC
  Administered 2015-08-01 – 2015-08-02 (×2): 1000 mg via ORAL
  Filled 2015-07-31 (×2): qty 10

## 2015-07-31 MED ORDER — MEPERIDINE HCL 25 MG/ML IJ SOLN: 6.2500 mg | INTRAMUSCULAR | Status: DC | PRN

## 2015-07-31 MED ORDER — MENTHOL 3 MG MT LOZG: 1.0000 | LOZENGE | OROMUCOSAL | Status: DC | PRN

## 2015-07-31 MED ORDER — BISACODYL 10 MG RE SUPP: 10.0000 mg | Freq: Every day | RECTAL | Status: DC | PRN

## 2015-07-31 MED ORDER — POLYETHYLENE GLYCOL 3350 17 G PO PACK
17.0000 g | PACK | Freq: Every day | ORAL | Status: DC | PRN
  Administered 2015-08-02: 17 g via ORAL
  Filled 2015-07-31 (×2): qty 1

## 2015-07-31 MED ORDER — METHOCARBAMOL 1000 MG/10ML IJ SOLN
500.0000 mg | Freq: Four times a day (QID) | INTRAVENOUS | Status: DC | PRN
  Filled 2015-07-31: qty 5

## 2015-07-31 MED ORDER — SODIUM CHLORIDE 0.9 % IV SOLN
INTRAVENOUS | Status: DC
  Administered 2015-07-31: 21:00:00 via INTRAVENOUS

## 2015-07-31 MED ORDER — PROMETHAZINE HCL 25 MG/ML IJ SOLN: 6.2500 mg | INTRAMUSCULAR | Status: DC | PRN

## 2015-07-31 MED ORDER — BIOTIN 5000 MCG PO CAPS: 5000.0000 ug | ORAL_CAPSULE | ORAL | Status: DC

## 2015-07-31 SURGICAL SUPPLY — 62 items
BLADE SAW SAG 73X25 THK (BLADE) ×1
BLADE SAW SGTL 73X25 THK (BLADE) ×1 IMPLANT
CAPT SHLDR TOTAL 2 ×2 IMPLANT
CEMENT HV SMART SET (Cement) ×2 IMPLANT
CONT SPEC 4OZ CLIKSEAL STRL BL (MISCELLANEOUS) ×2 IMPLANT
COVER SURGICAL LIGHT HANDLE (MISCELLANEOUS) ×2 IMPLANT
DRAPE IMP U-DRAPE 54X76 (DRAPES) ×2 IMPLANT
DRAPE INCISE IOBAN 66X45 STRL (DRAPES) ×6 IMPLANT
DRAPE U-SHAPE 47X51 STRL (DRAPES) ×2 IMPLANT
DRAPE X-RAY CASS 24X20 (DRAPES) IMPLANT
DRILL BIT 5/64 (BIT) ×2 IMPLANT
DRSG ADAPTIC 3X8 NADH LF (GAUZE/BANDAGES/DRESSINGS) ×2 IMPLANT
DRSG PAD ABDOMINAL 8X10 ST (GAUZE/BANDAGES/DRESSINGS) ×2 IMPLANT
DURAPREP 26ML APPLICATOR (WOUND CARE) ×2 IMPLANT
ELECT BLADE 4.0 EZ CLEAN MEGAD (MISCELLANEOUS) ×2
ELECT NEEDLE TIP 2.8 STRL (NEEDLE) ×2 IMPLANT
ELECT REM PT RETURN 9FT ADLT (ELECTROSURGICAL) ×2
ELECTRODE BLDE 4.0 EZ CLN MEGD (MISCELLANEOUS) ×1 IMPLANT
ELECTRODE REM PT RTRN 9FT ADLT (ELECTROSURGICAL) ×1 IMPLANT
GAUZE SPONGE 4X4 12PLY STRL (GAUZE/BANDAGES/DRESSINGS) ×2 IMPLANT
GLOVE BIOGEL PI ORTHO PRO SZ8 (GLOVE) ×1
GLOVE ORTHO TXT STRL SZ7.5 (GLOVE) ×2 IMPLANT
GLOVE PI ORTHO PRO STRL SZ8 (GLOVE) ×1 IMPLANT
GLOVE SURG ORTHO 8.5 STRL (GLOVE) ×4 IMPLANT
GOWN STRL REUS W/ TWL LRG LVL3 (GOWN DISPOSABLE) ×2 IMPLANT
GOWN STRL REUS W/ TWL XL LVL3 (GOWN DISPOSABLE) ×1 IMPLANT
GOWN STRL REUS W/TWL LRG LVL3 (GOWN DISPOSABLE) ×2
GOWN STRL REUS W/TWL XL LVL3 (GOWN DISPOSABLE) ×1
KIT BASIN OR (CUSTOM PROCEDURE TRAY) ×2 IMPLANT
KIT ROOM TURNOVER OR (KITS) ×2 IMPLANT
MANIFOLD NEPTUNE II (INSTRUMENTS) ×2 IMPLANT
NDL SUT 6 .5 CRC .975X.05 MAYO (NEEDLE) ×1 IMPLANT
NEEDLE 1/2 CIR MAYO (NEEDLE) ×2 IMPLANT
NEEDLE HYPO 25GX1X1/2 BEV (NEEDLE) ×2 IMPLANT
NEEDLE MAYO TAPER (NEEDLE) ×1
NS IRRIG 1000ML POUR BTL (IV SOLUTION) ×2 IMPLANT
PACK SHOULDER (CUSTOM PROCEDURE TRAY) ×2 IMPLANT
PACK UNIVERSAL I (CUSTOM PROCEDURE TRAY) ×2 IMPLANT
PAD ARMBOARD 7.5X6 YLW CONV (MISCELLANEOUS) ×4 IMPLANT
PIN METAGLENE 2.5 (PIN) IMPLANT
SLING ARM FOAM STRAP XLG (SOFTGOODS) ×2 IMPLANT
SMARTMIX MINI TOWER (MISCELLANEOUS) ×2
SPONGE LAP 18X18 X RAY DECT (DISPOSABLE) ×2 IMPLANT
SPONGE LAP 4X18 X RAY DECT (DISPOSABLE) ×2 IMPLANT
SPONGE SURGIFOAM ABS GEL SZ50 (HEMOSTASIS) IMPLANT
STRIP CLOSURE SKIN 1/2X4 (GAUZE/BANDAGES/DRESSINGS) ×2 IMPLANT
SUCTION FRAZIER HANDLE 10FR (MISCELLANEOUS) ×1
SUCTION TUBE FRAZIER 10FR DISP (MISCELLANEOUS) ×1 IMPLANT
SUT FIBERWIRE #2 38 T-5 BLUE (SUTURE) ×12
SUT MNCRL AB 4-0 PS2 18 (SUTURE) ×2 IMPLANT
SUT VIC AB 0 CT1 27 (SUTURE) ×1
SUT VIC AB 0 CT1 27XBRD ANBCTR (SUTURE) ×1 IMPLANT
SUT VIC AB 2-0 CT1 27 (SUTURE) ×1
SUT VIC AB 2-0 CT1 TAPERPNT 27 (SUTURE) ×1 IMPLANT
SUT VICRYL AB 2 0 TIES (SUTURE) ×2 IMPLANT
SUTURE FIBERWR #2 38 T-5 BLUE (SUTURE) ×6 IMPLANT
SYR CONTROL 10ML LL (SYRINGE) ×2 IMPLANT
TOWEL OR 17X24 6PK STRL BLUE (TOWEL DISPOSABLE) ×2 IMPLANT
TOWEL OR 17X26 10 PK STRL BLUE (TOWEL DISPOSABLE) ×2 IMPLANT
TOWER SMARTMIX MINI (MISCELLANEOUS) ×1 IMPLANT
WATER STERILE IRR 1000ML POUR (IV SOLUTION) ×2 IMPLANT
YANKAUER SUCT BULB TIP NO VENT (SUCTIONS) IMPLANT

## 2015-07-31 NOTE — Transfer of Care (Signed)
Immediate Anesthesia Transfer of Care Note  Patient: Rebecca Morse  Procedure(s) Performed: Procedure(s): TOTAL SHOULDER ARTHROPLASTY (Left)  Patient Location: PACU  Anesthesia Type:GA combined with regional for post-op pain  Level of Consciousness: awake, alert  and oriented  Airway & Oxygen Therapy: Patient Spontanous Breathing and Patient connected to nasal cannula oxygen  Post-op Assessment: Report given to RN and Post -op Vital signs reviewed and stable  Post vital signs: Reviewed and stable  Last Vitals:  Filed Vitals:   07/31/15 1310 07/31/15 1316  BP: 118/52 117/47  Pulse: 71 79  Temp:    Resp: 14 16    Complications: No apparent anesthesia complications

## 2015-07-31 NOTE — Discharge Instructions (Signed)
Ice to the shoulder as much as possible,  Ok to remove the sling in the home and hug a pillow.  Keep the arm propped forward so you can see your elbow and the forearm lays across your waist.  Keep the incision clean and dry and covered for one week, then ok to shower and get wound wet  Do exercises 4 times per day as instructed   Light activity only no heavy use of the left arm.  Follow up in two weeks in the office with Dr Ranell Patrick  [phone]

## 2015-07-31 NOTE — Progress Notes (Signed)
Utilization review completed.  

## 2015-07-31 NOTE — Brief Op Note (Signed)
07/31/2015  3:42 PM  PATIENT:  Rebecca Morse  65 y.o. female  PRE-OPERATIVE DIAGNOSIS:  LEFT SHOULDER OA, END STAGE  POST-OPERATIVE DIAGNOSIS:  LEFT SHOULDER OA, END STAGE  PROCEDURE:  Procedure(s): TOTAL SHOULDER ARTHROPLASTY (Left)  [employer], APG glenoid SURGEON:  Surgeon(s) and Role:    * Beverely Low, MD - Primary  PHYSICIAN ASSISTANT:   ASSISTANTS: Thea Gist, PA-C   ANESTHESIA:   regional and general  EBL:  Total I/O In: 1300 [I.V.:1300] Out: -   BLOOD ADMINISTERED:none  DRAINS: none   LOCAL MEDICATIONS USED:  MARCAINE     SPECIMEN:  No Specimen  DISPOSITION OF SPECIMEN:  N/A  COUNTS:  YES  TOURNIQUET:  * No tourniquets in log *  DICTATION: .Other Dictation: Dictation Number [id number]  PLAN OF CARE: Admit to inpatient   PATIENT DISPOSITION:  PACU - hemodynamically stable.   Delay start of Pharmacological VTE agent (>24hrs) due to surgical blood loss or risk of bleeding: no

## 2015-07-31 NOTE — Interval H&P Note (Signed)
History and Physical Interval Note:  07/31/2015 1:11 PM  Rebecca Morse  has presented today for surgery, with the diagnosis of LEFT SHOULDER OA  The various methods of treatment have been discussed with the patient and family. After consideration of risks, benefits and other options for treatment, the patient has consented to  Procedure(s): TOTAL SHOULDER ARTHROPLASTY (Left) as a surgical intervention .  The patient's history has been reviewed, patient examined, no change in status, stable for surgery.  I have reviewed the patient's chart and labs.  Questions were answered to the patient's satisfaction.     Kendra Grissett,STEVEN R

## 2015-07-31 NOTE — Anesthesia Procedure Notes (Addendum)
Anesthesia Regional Block:  Interscalene brachial plexus block  Pre-Anesthetic Checklist: ,, timeout performed, Correct Patient, Correct Site, Correct Laterality, Correct Procedure, Correct Position, site marked, Risks and benefits discussed,  Surgical consent,  Pre-op evaluation,  At surgeon's request and post-op pain management  Laterality: Left  Prep: chloraprep       Needles:  Injection technique: Single-shot  Needle Type: Echogenic Needle     Needle Length: 9cm 9 cm Needle Gauge: 21 and 21 G    Additional Needles:  Procedures: ultrasound guided (picture in chart) Interscalene brachial plexus block Narrative:  Start time: 07/31/2015 1:06 PM End time: 07/31/2015 1:08 PM Injection made incrementally with aspirations every 5 mL.  Performed by: Personally  Anesthesiologist: Shona Simpson D  Additional Notes: No immediate complications noted.    Procedure Name: Intubation Date/Time: 07/31/2015 1:41 PM Performed by: Marena Chancy Pre-anesthesia Checklist: Suction available, Emergency Drugs available, Timeout performed, Patient identified and Patient being monitored Patient Re-evaluated:Patient Re-evaluated prior to inductionOxygen Delivery Method: Circle system utilized Preoxygenation: Pre-oxygenation with 100% oxygen Intubation Type: IV induction Ventilation: Mask ventilation without difficulty and Oral airway inserted - appropriate to patient size Laryngoscope Size: Hyacinth Meeker and 2 Grade View: Grade I Tube type: Oral Tube size: 7.0 mm Number of attempts: 1 Placement Confirmation: ETT inserted through vocal cords under direct vision,  positive ETCO2 and breath sounds checked- equal and bilateral Tube secured with: Tape Dental Injury: Teeth and Oropharynx as per pre-operative assessment

## 2015-07-31 NOTE — Anesthesia Preprocedure Evaluation (Signed)
Anesthesia Evaluation  Patient identified by MRN, date of birth, ID band Patient awake    Reviewed: Allergy & Precautions, NPO status , Patient's Chart, lab work & pertinent test results, reviewed documented beta blocker date and time   Airway Mallampati: II  TM Distance: >3 FB Neck ROM: Full    Dental  (+) Teeth Intact   Pulmonary former smoker,    breath sounds clear to auscultation       Cardiovascular hypertension, Pt. on medications and Pt. on home beta blockers  Rhythm:Regular Rate:Normal     Neuro/Psych  Headaches, PSYCHIATRIC DISORDERS Anxiety Depression  Neuromuscular disease    GI/Hepatic negative GI ROS, Neg liver ROS,   Endo/Other  negative endocrine ROS  Renal/GU negative Renal ROS  negative genitourinary   Musculoskeletal  (+) Arthritis , Osteoarthritis,  Fibromyalgia -  Abdominal   Peds negative pediatric ROS (+)  Hematology negative hematology ROS (+)   Anesthesia Other Findings   Reproductive/Obstetrics negative OB ROS                             Anesthesia Physical Anesthesia Plan  ASA: III  Anesthesia Plan: General   Post-op Pain Management: GA combined w/ Regional for post-op pain   Induction: Intravenous  Airway Management Planned: Oral ETT  Additional Equipment:   Intra-op Plan:   Post-operative Plan: Extubation in OR  Informed Consent: I have reviewed the patients History and Physical, chart, labs and discussed the procedure including the risks, benefits and alternatives for the proposed anesthesia with the patient or authorized representative who has indicated his/her understanding and acceptance.   Dental advisory given  Plan Discussed with: CRNA  Anesthesia Plan Comments:         Anesthesia Quick Evaluation

## 2015-08-01 LAB — BASIC METABOLIC PANEL
ANION GAP: 6 (ref 5–15)
BUN: 10 mg/dL (ref 6–20)
CALCIUM: 8.4 mg/dL — AB (ref 8.9–10.3)
CO2: 24 mmol/L (ref 22–32)
Chloride: 104 mmol/L (ref 101–111)
Creatinine, Ser: 0.61 mg/dL (ref 0.44–1.00)
Glucose, Bld: 153 mg/dL — ABNORMAL HIGH (ref 65–99)
Potassium: 4.1 mmol/L (ref 3.5–5.1)
Sodium: 134 mmol/L — ABNORMAL LOW (ref 135–145)

## 2015-08-01 LAB — HEMOGLOBIN AND HEMATOCRIT, BLOOD
HEMATOCRIT: 35.2 % — AB (ref 36.0–46.0)
HEMOGLOBIN: 12 g/dL (ref 12.0–15.0)

## 2015-08-01 NOTE — Discharge Summary (Signed)
Physician Discharge Summary   Patient ID: Rebecca Morse MRN: [id number] DOB/AGE: Jul 30, 1950 65 y.o.  Admit date: 07/31/2015 Discharge date: 08/01/2015  Admission Diagnoses:  Active Problems:   S/P shoulder replacement   Discharge Diagnoses:  Same   Surgeries: Procedure(s): TOTAL SHOULDER ARTHROPLASTY on 07/31/2015   Consultants: OT, PT, D/C planning  Discharged Condition: Stable  Hospital Course: Rebecca Morse is an 65 y.o. female who was admitted 07/31/2015 with a chief complaint of left shoulder pain, and found to have a diagnosis of severe left shoulder OA.  They were brought to the operating room on 07/31/2015 and underwent the above named procedures.    The patient had an uncomplicated hospital course and was stable for discharge.  Recent vital signs:  Filed Vitals:   08/01/15 0125 08/01/15 0515  BP: 128/62 120/47  Pulse: 98 105  Temp: 100.6 F (38.1 C) 99 F (37.2 C)  Resp: 18 16    Recent laboratory studies:  Results for orders placed or performed during the hospital encounter of 07/31/15  Hemoglobin and hematocrit, blood  Result Value Ref Range   Hemoglobin 12.0 12.0 - 15.0 g/dL   HCT 40.9 (L) 81.1 - 91.4 %  Basic metabolic panel  Result Value Ref Range   Sodium 134 (L) 135 - 145 mmol/L   Potassium 4.1 3.5 - 5.1 mmol/L   Chloride 104 101 - 111 mmol/L   CO2 24 22 - 32 mmol/L   Glucose, Bld 153 (H) 65 - 99 mg/dL   BUN 10 6 - 20 mg/dL   Creatinine, Ser 7.82 0.44 - 1.00 mg/dL   Calcium 8.4 (L) 8.9 - 10.3 mg/dL   GFR calc non Af Amer >60 >60 mL/min   GFR calc Af Amer >60 >60 mL/min   Anion gap 6 5 - 15    Discharge Medications:     Medication List    TAKE these medications        ALPRAZolam 0.5 MG tablet  Commonly known as:  XANAX  Take 0.5 mg by mouth 3 (three) times daily as needed for anxiety.     amLODipine 10 MG tablet  Commonly known as:  NORVASC  Take 10 mg by mouth daily.     atenolol 50 MG tablet  Commonly known as:  TENORMIN   Take 50 mg by mouth daily.     Biotin 5000 MCG Caps  Take 5,000 mcg by mouth 1 day or 1 dose.     Coconut Oil 1000 MG Caps  Take 1,000 mg by mouth daily.     DULoxetine 30 MG capsule  Commonly known as:  CYMBALTA  Take 30 mg by mouth 2 (two) times daily.     FORTIFY DAILY PROBIOTIC PO  Take 1 tablet by mouth daily.     lisinopril 20 MG tablet  Commonly known as:  PRINIVIL,ZESTRIL  Take 20 mg by mouth daily.     methocarbamol 500 MG tablet  Commonly known as:  ROBAXIN  Take 1 tablet (500 mg total) by mouth 3 (three) times daily as needed.     multivitamin tablet  Take 1 tablet by mouth daily.     oxyCODONE-acetaminophen 5-325 MG tablet  Commonly known as:  ROXICET  Take 1-2 tablets by mouth every 4 (four) hours as needed for severe pain.     VITAMIN C PO  Take 1,000 mg by mouth daily.        Diagnostic Studies: Dg Shoulder Left Port  07/31/2015  CLINICAL DATA:  Status post left shoulder arthroplasty EXAM: LEFT SHOULDER - 1 VIEW COMPARISON:  None. FINDINGS: A left shoulder replacement is seen. No acute abnormality is noted. A well seated in the glenoid. No other focal abnormality is noted. IMPRESSION: Status post left shoulder arthroplasty. Electronically Signed   By: Alcide Clever M.D.   On: 07/31/2015 18:54    Disposition: 06-Home-Health Care Svc        Follow-up Information    Follow up with Amarachukwu Lakatos,STEVEN R, MD. Call in 2 weeks.   Specialty:  Orthopedic Surgery   Why:  [phone]   Contact information:   [address] [place] [address] [phone]        Signed: Verlee Rossetti 08/01/2015, 8:00 AM

## 2015-08-01 NOTE — Progress Notes (Signed)
Orthopedics Progress Note  Subjective: Patient reports quite a bit of pain this morning after the block wore off.  Objective:  Filed Vitals:   08/01/15 0125 08/01/15 0515  BP: 128/62 120/47  Pulse: 98 105  Temp: 100.6 F (38.1 C) 99 F (37.2 C)  Resp: 18 16    General: Awake and alert  Musculoskeletal: left shoulder dressing CDI, NVI distally including ax nerve Neurovascularly intact  Lab Results  Component Value Date   WBC 9.9 07/28/2015   HGB 12.0 08/01/2015   HCT 35.2* 08/01/2015   MCV 83.7 07/28/2015   PLT 304 07/28/2015       Component Value Date/Time   NA 134* 08/01/2015 0524   K 4.1 08/01/2015 0524   CL 104 08/01/2015 0524   CO2 24 08/01/2015 0524   GLUCOSE 153* 08/01/2015 0524   BUN 10 08/01/2015 0524   CREATININE 0.61 08/01/2015 0524   CALCIUM 8.4* 08/01/2015 0524   GFRNONAA >60 08/01/2015 0524   GFRAA >60 08/01/2015 0524    Lab Results  Component Value Date   INR 1.01 04/03/2013   INR 1.04 09/08/2009    Assessment/Plan: POD #1 s/p Procedure(s): TOTAL SHOULDER ARTHROPLASTY Pain control, PT, OT Home health.  Possible D/C later today if pain under better control and she does well with therapy and home health arranged  Almedia Balls. Ranell Patrick, MD 08/01/2015 7:58 AM

## 2015-08-01 NOTE — Progress Notes (Signed)
OT Evaluation  History: s/p L TSA  PTA, pt lived alone and was independent with ADL and mobility. Pt currently requires mod A for ADL. Pt is not able to manage her care at this time in order to safely D/C home today. Pt will benefit from staying tonight and D/C home tomorrow when her daughter arrives to help care for her. Pt will need to follow up with HHOT. Will plan to see in am to complete educaiton to facilitate a safe D/C home with intial 24/7 S.     08/01/15 0800  OT Visit Information  Last OT Received On 08/01/15  Assistance Needed +1  History of Present Illness L TSA  Precautions  Precautions Shoulder  Type of Shoulder Precautions Active FF 0-90; Abd 0-6-; ER 0-30  Shoulder Interventions Shoulder sling/immobilizer;For comfort  Precaution Booklet Issued Yes (comment)  Restrictions  LUE Weight Bearing NWB  Home Living  Family/patient expects to be discharged to: Private residence  Living Arrangements Children  Available Help at Discharge Family;Available PRN/intermittently  Type of Home House  Home Access Stairs to enter  Entrance Stairs-Number of Steps 4  Entrance Stairs-Rails Right;Left  Home Layout Two level;Bed/bath upstairs;Able to live on main level with bedroom/bathroom  Bathroom Shower/Tub Tub/shower unit  Shower/tub characteristics Curtain  Horticulturist, commercial Yes  How Accessible Accessible via walker  Home Equipment Cane - single point;Shower seat;Walker - 4 wheels;Walker - 2 wheels  Prior Function  Level of Independence Independent  Communication  Communication No difficulties  Pain Assessment  Pain Assessment 0-10  Pain Score 7  Pain Location L shoulder  Pain Descriptors / Indicators Aching;Throbbing;Squeezing  Pain Intervention(s) Limited activity within patient's tolerance;Ice applied;Repositioned  Cognition  Arousal/Alertness Awake/alert  Behavior During Therapy WFL for tasks assessed/performed  Overall Cognitive Status  Within Functional Limits for tasks assessed  Upper Extremity Assessment  Upper Extremity Assessment LUE deficits/detail  Lower Extremity Assessment  Lower Extremity Assessment Defer to PT evaluation (s/p R hip THA 2014)  Cervical / Trunk Assessment  Cervical / Trunk Assessment Normal  ADL  Overall ADL's  Needs assistance/impaired  Eating/Feeding Set up  Grooming Moderate assistance;Sitting  Upper Body Bathing Moderate assistance;Sitting  Lower Body Bathing Moderate assistance;Sit to/from stand  Upper Body Dressing  Moderate assistance;Sitting  Lower Body Dressing Sit to/from stand;Minimal assistance  Toilet Transfer Min guard;Ambulation  Toileting- IT trainer Min guard;Sit to/from stand  Functional mobility during ADLs Min guard (unsteady. ?pain meds)  General ADL Comments began education regading compensatory techniques for ADL and sling management. Written information given. educated on proper positioing of LUE and pain managemtn with ice and edema control.  Bed Mobility  Overal bed mobility Modified Independent  Exercises  Exercises Shoulder  Shoulder Instructions  Donning/doffing shirt without moving shoulder Moderate assistance  Method for sponge bathing under operated UE Moderate assistance  Donning/doffing sling/immobilizer Moderate assistance  Correct positioning of sling/immobilizer Moderate assistance  ROM for elbow, wrist and digits of operated UE Minimal assistance  Sling wearing schedule (on at all times/off for ADL's) Minimal assistance  Proper positioning of operated UE when showering Minimal assistance  Positioning of UE while sleeping Minimal assistance  Shoulder Exercises  Shoulder Flexion AAROM;Left;5 reps;Supine  Shoulder ABduction AAROM;Left;5 reps;Supine  Shoulder External Rotation AAROM;Left;5 reps;Supine  Elbow Flexion AROM;AAROM;Left;10 reps;Seated  Elbow Extension AROM;AAROM;Left;10 reps;Seated  Wrist Flexion AROM;Left;10 reps   Wrist Extension AROM;Left;10 reps  Digit Composite Flexion AROM;Left;10 reps  Composite Extension AROM;Left;10 reps  OT - End of  Session  Activity Tolerance Patient tolerated treatment well  Patient left in chair;with call bell/phone within reach  Nurse Communication Mobility status  OT Assessment  OT Therapy Diagnosis  Generalized weakness;Acute pain  OT Recommendation/Assessment Patient needs continued OT Services  OT Problem List Decreased strength;Decreased range of motion;Decreased activity tolerance;Decreased knowledge of use of DME or AE;Decreased knowledge of precautions;Obesity;Impaired UE functional use;Pain;Increased edema  Barriers to Discharge Decreased caregiver support  Barriers to Discharge Comments Daughter will not arrive until Sunday  OT Plan  OT Frequency (ACUTE ONLY) Min 2X/week  OT Treatment/Interventions (ACUTE ONLY) Self-care/ADL training;Therapeutic exercise;DME and/or AE instruction;Therapeutic activities;Patient/family education  OT Recommendation  Follow Up Recommendations Home health OT;Supervision/Assistance - 24 hour  OT Equipment None recommended by OT  Individuals Consulted  Consulted and Agree with Results and Recommendations Patient  Acute Rehab OT Goals  Patient Stated Goal to be able to use my arm  OT Goal Formulation With patient  Time For Goal Achievement 08/08/15  Potential to Achieve Goals Good  OT Time Calculation  OT Start Time (ACUTE ONLY) 0850  OT Stop Time (ACUTE ONLY) 0926  OT Time Calculation (min) 36 min  OT General Charges  $OT Visit 1 Procedure  OT Evaluation  $OT Eval Moderate Complexity 1 Procedure  OT Treatments  $Therapeutic Activity 8-22 mins  Written Expression  Dominant Hand Right  Luisa Dago, OTR/L  [phone] 08/01/2015

## 2015-08-01 NOTE — Op Note (Signed)
Rebecca Morse, Rebecca Morse             ACCOUNT NO.:  [id number]  MEDICAL RECORD NO.:  [id number]  LOCATION:  5N11C                        FACILITY:  [facility]  PHYSICIAN:  Almedia Balls. Ranell Patrick, M.D. DATE OF BIRTH:  12-20-1950  DATE OF PROCEDURE:  07/31/2015 DATE OF DISCHARGE:                              OPERATIVE REPORT   PREOPERATIVE DIAGNOSIS:  Left shoulder end-stage osteoarthritis.  POSTOPERATIVE DIAGNOSIS:  Left shoulder end-stage osteoarthritis.  PROCEDURE PERFORMED:  Left total shoulder arthroplasty using DePuy Global Unite stem with APG glenoid.  ATTENDING SURGEON:  Almedia Balls. Ranell Patrick, MD.  ASSISTANT:  Standley Dakins, PA-C, who scrubbed the entire procedure and was necessary for satisfactory completion of the surgery.  ANESTHESIA:  General anesthesia was used plus interscalene block.  ESTIMATED BLOOD LOSS:  100 mL.  FLUID REPLACED:  1500 mL crystalloids.  INSTRUMENT COUNTS:  Correct.  COMPLICATIONS:  There were no complications.  ANTIBIOTICS:  Perioperative antibiotics were given.  INDICATIONS:  The patient is a 65 year old female with progressive shoulder arthritis complaining of severe debilitating pain and loss of function.  The patient has end-stage arthritis on x-ray and MRI evaluation with complete loss of hyaline cartilage and a __________ deformity in the glenoid due to progressive pain and failure of conservative management including injections, modification activity, anti-inflammatories.  The patient presents now for total shoulder arthroplasty to relieve pain and restore function.  Informed consent was obtained.  DESCRIPTION OF PROCEDURE:  After an adequate level of anesthesia was achieved, the patient was positioned in a modified beach-chair position. Left shoulder was correctly identified and sterilely prepped and draped in usual manner.  Time-out was called.  We initiated surgery through a deltopectoral incision starting at the coracoid process extending  down to the anterior humerus.  This was done with a 10 blade scalpel. Dissection down through subcutaneous tissues using Bovie, we identified the deltopectoral interval with the cephalic vein taking the cephalic vein laterally with the deltoid and the pectoralis medially.  A conjoint tendon was identified and retracted medially.  Subscapularis was released subperiosteally off the lesser tuberosity and tagged with #2 FiberWire suture for repair at the end.  We released the inferior capsule off the inferior neck progressively externally rotating.  We then placed a T-handled Crego elevator over the top of the humeral head right at the rotator cuff margin and insertion on the greater tuberosity and then placed a large Crego elevator medially.  We placed our neck resection guide, marked our humerus and then resected with about 25 degrees of external rotation, thus 25 degrees of retroversion on our cut.  We used an oscillating saw, had a perfect cut right even with the rotator cuff insertion.  We then removed excess osteophytes off the medial aspect of the humerus and posterior aspect of the humerus.  We then prepared the humerus with sequential reaming.  We tenodesed the biceps with appropriate tension and tenodesed it into the pectoralis insertion.  Next, we went ahead and prepared our canal with reaming up to size 12 canal diameter, and then we did our broaching for the Global Unite Stem, and we broached first with a 10 and then a 12 __________ anterior bicipital groove.  This gave [place]  the appropriate 25 degrees of retroversion.  At this point, we removed the trial component from the humerus, subluxed the humerus posteriorly, did a 360-degree capsular removal protecting the axillary nerve.  We released the subscapularis. We glided it nicely, removed the biceps anchor and superior labrum and the inferior labrum.  We found our center point.  We removed the remaining cartilage in the shoulder.   There was not much on the glenoid, and then placed our center point a guide pin, reamed initially with the powered reamer, and then used the hand reamer peripherally to prepare for the APG glenoid system.  We selected a 44 size, drilled our central peg hole, then used our gold guide for our three peripheral holes which we drilled in sequence.  We then trialed with a 44 glenoid.  This was a perfect fit and good coverage.  We then placed a Gelfoam soaked with thrombin in the three peripheral holes and dried as well.  Vacuum mixed with cement and used DePuy high viscosity cement and then pressurized that and placed that in the three peripheral holes.  We then impacted the APG glenoid in position with secure fit, allowed the cement to harden on the back table.  It was about 18 minutes, the room was little cold.  Once the cement was hardened, we checked for security of her APG glenoid which was nice and secure and flushed.  We removed all our retractors, irrigated thoroughly, inspected our rotator cuff entirety which was still intact.  We then went ahead and finished our humeral preparation with drilling holes and placing #2 FiberWire suture in the lesser tuberosity for repair of the tendon to bone.  We then used impaction grafting technique with available bone graft from the humeral head and impacted the real Global Unite system 12 body 12 metaphysis with that fin adjacent to the bicipital groove impacting that into that bone grafted impaction grafted area.  We had excellent secure fixation. Nice flush fit at the trunnion on the cut edge of the humerus.  We then trialed our humeral head which we selected a 44 x18 Eccentric and dialed that to superiorly and posteriorly, had excellent coverage of the proximal humerus, reduced the shoulder, had about 50% translation inferiorly and posteriorly.  So, we had the appropriate length on the humeral side.  We then removed the trial humeral head and then  impacted the real humeral head in position, again a 44 x 18 Eccentric, dialed slightly north and then posteriorly.  Good coverage reduced the shoulder again nice and stable.  We irrigated thoroughly and then closed the subscapularis back to the lesser tuberosity using directly to bone and also anatomically repairing the rotator interval.  We were pleased with the repair.  We were able to range her shoulder about 20 degrees of external rotation, internal rotation to her abdomen and then forward elevation full.  We irrigated again and closed the deltopectoral interval with 0 Vicryl suture, followed by 2-0 Vicryl subcutaneous closure and 4-0 Monocryl for skin.  Steri-Strips were applied followed by sterile dressing.  The patient tolerated the surgery well.     Almedia Balls. Ranell Patrick, M.D.     SRN/MEDQ  D:  07/31/2015  T:  07/31/2015  Job:  [id number]

## 2015-08-02 NOTE — Progress Notes (Signed)
Discharge instructions gave to patient and all questions answered. And patient  is ready to discharge.

## 2015-08-02 NOTE — Care Management Note (Signed)
Case Management Note  Patient Details  Name: Rebecca Morse MRN: [id number] Date of Birth: 04-21-1951  Subjective/Objective:                  TOTAL SHOULDER ARTHROPLASTY (Left)  Action/Plan: CM spoke to patient at the bedside and offered choice for [facility] PT/OT/Aide and patient chose [place] who she has used before. Patient said that she could benefit from using a 3N1-CM called Trey Paula with [facility] to deliver 3N1 to bedside. Patient to have intermittent help at home with her daughter. CM called Lupita Leash with [facility] for the referral and referral accepted for PT/OT/Aide. Patient states she has no further needs at this time. Contact information verified.   Expected Discharge Date:  08/02/15               Expected Discharge Plan:  Home w Home Health Services  In-House Referral:     Discharge planning Services  CM Consult  Post Acute Care Choice:  Home Health, Durable Medical Equipment Choice offered to:  Patient  DME Arranged:  3-N-1 DME Agency:  [facility]  HH Arranged:  PT, OT, Nurse's Aide HH Agency:  [facility]  Status of Service:  Completed, signed off  Medicare Important Message Given:    Date Medicare IM Given:    Medicare IM give by:    Date Additional Medicare IM Given:    Additional Medicare Important Message give by:     If discussed at Long Length of Stay Meetings, dates discussed:    Additional Comments:  Darcel Smalling, RN 08/02/2015, 11:16 AM

## 2015-08-02 NOTE — Progress Notes (Signed)
Orthopedics Progress Note  Subjective: Patient requesting instruction on how to get out of bed  Objective:  Filed Vitals:   08/01/15 2311 08/02/15 0541  BP:  113/44  Pulse:  102  Temp: 100.9 F (38.3 C) 98 F (36.7 C)  Resp:  18    General: Awake and alert  Musculoskeletal: left shoulder wound CDI, dressing changed, no drainage and no erythema Neurovascularly intact  Lab Results  Component Value Date   WBC 9.9 07/28/2015   HGB 12.0 08/01/2015   HCT 35.2* 08/01/2015   MCV 83.7 07/28/2015   PLT 304 07/28/2015       Component Value Date/Time   NA 134* 08/01/2015 0524   K 4.1 08/01/2015 0524   CL 104 08/01/2015 0524   CO2 24 08/01/2015 0524   GLUCOSE 153* 08/01/2015 0524   BUN 10 08/01/2015 0524   CREATININE 0.61 08/01/2015 0524   CALCIUM 8.4* 08/01/2015 0524   GFRNONAA >60 08/01/2015 0524   GFRAA >60 08/01/2015 0524    Lab Results  Component Value Date   INR 1.01 04/03/2013   INR 1.04 09/08/2009    Assessment/Plan: POD #2 s/p Procedure(s): TOTAL SHOULDER ARTHROPLASTY PT - will need mobility instruction especially on getting out of bed.  Discussed with her a scoliosis roll may benefit her so she does not load bear her body weight with the left shoulder and arm. OT, continue rehab Will need home health PT, OT, and possibly non skilled aide if PT or OT feel it would be appropriate. Thanks  Almedia Balls. Ranell Patrick, MD 08/02/2015 7:25 AM

## 2015-08-02 NOTE — Progress Notes (Signed)
Occupational Therapy Treatment Patient Details Name: Rebecca Morse MRN: [id number] DOB: Apr 25, 1951 Today's Date: 08/02/2015    History of present illness L TSA   OT comments  Making excellent progress. Completed ADL session with pt reviewed compensatory techniques regarding L UE. Discussed possible need for 3 in 1. Pt to think about it and let CM know. Will return to complete exercises and education with daughter when she arrives. Recommend HHOT and Aide as pt will have intermittent S.   Follow Up Recommendations  Home health OT;Supervision/Assistance - 24 hour    Equipment Recommendations  None recommended by OT    Recommendations for Other Services      Precautions / Restrictions Precautions Precautions: Shoulder Type of Shoulder Precautions: Active FF 0-90; Abd 0-6-; ER 0-30 Shoulder Interventions: Shoulder sling/immobilizer;For comfort Precaution Booklet Issued: Yes (comment) Restrictions LUE Weight Bearing: Non weight bearing       Mobility Bed Mobility               General bed mobility comments: pt up in chair.   Transfers Overall transfer level: Modified independent                    Balance Overall balance assessment: Needs assistance           Standing balance-Leahy Scale: Fair Standing balance comment: uses straight cane for support                   ADL                                       Functional mobility during ADLs: Supervision/safety;Cane General ADL Comments: Completed ADL session with pt, instructing pt in compensatory techniques for ADL and pt return demonstrating.regarding bathing, grooming, dressing, and using AE to help achieve goals of being mod I . Pt used long handled sponge for back a dn reacher and sock aid for LB dressing. Also educated pt on technique for donning/doffing sling. Pt able to return demonstrate.       Vision                     Perception     Praxis       Cognition   Behavior During Therapy: WFL for tasks assessed/performed Overall Cognitive Status: Within Functional Limits for tasks assessed                       Extremity/Trunk Assessment               Exercises Donning/doffing shirt without moving shoulder: Supervision/safety Method for sponge bathing under operated UE: Supervision/safety Donning/doffing sling/immobilizer: Supervision/safety Correct positioning of sling/immobilizer: Supervision/safety Sling wearing schedule (on at all times/off for ADL's): Supervision/safety Proper positioning of operated UE when showering: Supervision/safety Positioning of UE while sleeping: Supervision/safety   Shoulder Instructions Shoulder Instructions Donning/doffing shirt without moving shoulder: Supervision/safety Method for sponge bathing under operated UE: Supervision/safety Donning/doffing sling/immobilizer: Supervision/safety Correct positioning of sling/immobilizer: Supervision/safety Sling wearing schedule (on at all times/off for ADL's): Supervision/safety Proper positioning of operated UE when showering: Supervision/safety Positioning of UE while sleeping: Supervision/safety     General Comments      Pertinent Vitals/ Pain       Pain Assessment: 0-10 Pain Score: 5  Pain Location: L shoulder Pain Descriptors / Indicators: Aching Pain Intervention(s): Limited activity within patient's tolerance  Home Living                                          Prior Functioning/Environment              Frequency Min 2X/week     Progress Toward Goals  OT Goals(current goals can now be found in the care plan section)  Progress towards OT goals: Progressing toward goals  Acute Rehab OT Goals Patient Stated Goal: to be able to use my arm OT Goal Formulation: With patient Time For Goal Achievement: 08/08/15 Potential to Achieve Goals: Good ADL Goals Pt Will Perform Upper Body Bathing: with  min assist;with caregiver independent in assisting;sitting Pt Will Perform Upper Body Dressing: with min assist;with caregiver independent in assisting;sitting Pt Will Transfer to Toilet: with supervision;ambulating Pt Will Perform Toileting - Clothing Manipulation and hygiene: with supervision;sit to/from stand Pt/caregiver will Perform Home Exercise Program: Increased ROM;Left upper extremity;With written HEP provided;With Supervision  Plan Discharge plan remains appropriate    Co-evaluation                 End of Session     Activity Tolerance Patient tolerated treatment well   Patient Left in chair;with call bell/phone within reach   Nurse Communication Mobility status        Time: 4098-1191 OT Time Calculation (min): 40 min  Charges: OT General Charges $OT Visit: 1 Procedure OT Treatments $Self Care/Home Management : 38-52 mins  Jonathen Rathman,HILLARY 08/02/2015, 11:26 AM   Luisa Dago, OTR/L  [phone] 08/02/2015

## 2015-08-02 NOTE — Progress Notes (Signed)
Occupational Therapy Treatment Patient Details Name: Rebecca Morse MRN: [id number] DOB: 12-21-1950 Today's Date: 08/02/2015    History of present illness L TSA   OT comments  Education completed with daughter. Bed mobility education completed with pt - mod I with bed mobility while maintaining NWB status LUE. Decreased pain and improved ROM L shoulder today. See below. Pt safe to D/C home with daughter. Further OT to be addressed by [place].   Follow Up Recommendations  Home health OT;Supervision/Assistance - 24 hour (initially)    Equipment Recommendations  3 in 1 bedside comode    Recommendations for Other Services      Precautions / Restrictions Precautions Precautions: Shoulder Type of Shoulder Precautions: Active FF 0-90; Abd 0-6-; ER 0-30 Shoulder Interventions: Shoulder sling/immobilizer;For comfort Precaution Booklet Issued: Yes (comment) Restrictions LUE Weight Bearing: Non weight bearing       Mobility Bed Mobility Overal bed mobility:  (Dr expressed concerns over bed mobility. )             General bed mobility comments: Pt educated in bed mobility techniques to avoid WB through LUE. Pt able to return demonstrate sidelying to sit/supine without difficulty  Transfers Overall transfer level: Modified independent                    Balance Overall balance assessment: Needs assistance           Standing balance-Leahy Scale: Fair Standing balance comment: uses straight cane                   ADL                                       Functional mobility during ADLs: Modified independent;Cane General ADL Comments: Completed educatioin with daughter regarding compensatory techniques for ADL. Also educated on home safety and reducing risk of falls at home. Handout given. Pt given elastic shoestrings to help with donning/doffing sneakers.       Vision                     Perception     Praxis      Cognition    Behavior During Therapy: [facility] for tasks assessed/performed Overall Cognitive Status: Within Functional Limits for tasks assessed                       Extremity/Trunk Assessment               Exercises Shoulder Exercises Shoulder Flexion: AAROM;PROM;Left;10 reps;Supine (0-45) Shoulder ABduction: PROM;AAROM;Left;10 reps;Supine (0-30) Shoulder External Rotation: AAROM;PROM;Left;10 reps (_10 from neutral) Elbow Flexion: AROM;AAROM;Left;Seated Elbow Extension: AROM;AAROM;Left;10 reps;Seated Wrist Flexion: AROM;Left;10 reps Wrist Extension: AROM;Left;10 reps Digit Composite Flexion: AROM;Left;10 reps Composite Extension: AROM;Left;10 reps Donning/doffing shirt without moving shoulder: Supervision/safety Method for sponge bathing under operated UE: Supervision/safety Donning/doffing sling/immobilizer: Supervision/safety Correct positioning of sling/immobilizer: Supervision/safety Sling wearing schedule (on at all times/off for ADL's): Supervision/safety Proper positioning of operated UE when showering: Supervision/safety Positioning of UE while sleeping: Supervision/safety   Shoulder Instructions Shoulder Instructions Donning/doffing shirt without moving shoulder: Supervision/safety Method for sponge bathing under operated UE: Supervision/safety Donning/doffing sling/immobilizer: Supervision/safety Correct positioning of sling/immobilizer: Supervision/safety Sling wearing schedule (on at all times/off for ADL's): Supervision/safety Proper positioning of operated UE when showering: Supervision/safety Positioning of UE while sleeping: Supervision/safety     General Comments  Pertinent Vitals/ Pain       Pain Assessment: 0-10 Pain Score: 4  Pain Location: L shoulder Pain Descriptors / Indicators: Aching Pain Intervention(s): Limited activity within patient's tolerance  Home Living                                          Prior  Functioning/Environment              Frequency Min 2X/week     Progress Toward Goals  OT Goals(current goals can now be found in the care plan section)  Progress towards OT goals: Goals met/education completed, patient discharged from OT (D/C from acute OT)  Acute Rehab OT Goals Patient Stated Goal: to be able to use my arm OT Goal Formulation: With patient Time For Goal Achievement: 08/08/15 Potential to Achieve Goals: Good ADL Goals Pt Will Perform Upper Body Bathing: with min assist;with caregiver independent in assisting;sitting Pt Will Perform Upper Body Dressing: with min assist;with caregiver independent in assisting;sitting Pt Will Transfer to Toilet: with supervision;ambulating Pt Will Perform Toileting - Clothing Manipulation and hygiene: with supervision;sit to/from stand Pt/caregiver will Perform Home Exercise Program: Increased ROM;Left upper extremity;With written HEP provided;With Supervision  Plan All goals met and education completed, patient discharged from OT services (further OT to be addressed by [facility])    Co-evaluation                 End of Session Equipment Utilized During Treatment: Gait belt;Other (comment) (cane)   Activity Tolerance Patient tolerated treatment well   Patient Left in chair;with call bell/phone within reach;with family/visitor present   Nurse Communication Mobility status;Other (comment) (ready for D/C)        Time: 0950-1020 OT Time Calculation (min): 30 min  Charges: OT General Charges $OT Visit: 1 Procedure OT Treatments  $Therapeutic Activity: 8-22 mins $Therapeutic Exercise: 8-22 mins  Kolette Vey,HILLARY 08/02/2015, 11:34 AM   Maurie Boettcher, OTR/L  [phone] 08/02/2015

## 2015-08-03 ENCOUNTER — Encounter (HOSPITAL_COMMUNITY): Payer: Self-pay | Admitting: Orthopedic Surgery

## 2015-08-03 NOTE — Anesthesia Postprocedure Evaluation (Signed)
Anesthesia Post Note  Patient: Rebecca Morse  Procedure(s) Performed: Procedure(s) (LRB): TOTAL SHOULDER ARTHROPLASTY (Left)  Patient location during evaluation: PACU Anesthesia Type: General and Regional Level of consciousness: awake and alert Pain management: pain level controlled Vital Signs Assessment: post-procedure vital signs reviewed and stable Respiratory status: spontaneous breathing, nonlabored ventilation, respiratory function stable and patient connected to nasal cannula oxygen Cardiovascular status: blood pressure returned to baseline and stable Postop Assessment: no signs of nausea or vomiting Anesthetic complications: no    Last Vitals:  Filed Vitals:   08/02/15 0541 08/02/15 1100  BP: 113/44   Pulse: 102 74  Temp: 36.7 C   Resp: 18     Last Pain:  Filed Vitals:   08/02/15 1212  PainSc: 2                  Shelton Silvas

## 2017-11-07 DIAGNOSIS — R1012 Left upper quadrant pain: Secondary | ICD-10-CM | POA: Insufficient documentation

## 2017-11-07 DIAGNOSIS — D171 Benign lipomatous neoplasm of skin and subcutaneous tissue of trunk: Secondary | ICD-10-CM | POA: Insufficient documentation

## 2017-12-26 DIAGNOSIS — Z09 Encounter for follow-up examination after completed treatment for conditions other than malignant neoplasm: Secondary | ICD-10-CM | POA: Insufficient documentation

## 2018-03-13 ENCOUNTER — Encounter: Payer: Self-pay | Admitting: Gastroenterology

## 2018-05-23 DIAGNOSIS — M25552 Pain in left hip: Secondary | ICD-10-CM | POA: Insufficient documentation

## 2018-05-24 NOTE — Progress Notes (Signed)
Please place orders in Epic as patient is being scheduled for a pre-op appointment! Thank you! 

## 2018-06-01 NOTE — H&P (Signed)
TOTAL HIP ADMISSION H&P  Patient is admitted for left total hip arthroplasty, anterior approach.  Subjective:  Chief Complaint:   Left hip primary OA / pain  HPI: Rebecca Morse, 67 y.o. female, has a history of pain and functional disability in the left hip(s) due to arthritis and patient has failed non-surgical conservative treatments for greater than 12 weeks to include NSAID's and/or analgesics, use of assistive devices and activity modification.  Onset of symptoms was gradual starting 2+ years ago with gradually worsening course since that time.The patient noted prior procedures of the hip to include arthroplasty on the right hip(s).  Patient currently rates pain in the left hip at 10 out of 10 with activity. Patient has night pain, worsening of pain with activity and weight bearing, trendelenberg gait, pain that interfers with activities of daily living and pain with passive range of motion. Patient has evidence of periarticular osteophytes and joint space narrowing by imaging studies. This condition presents safety issues increasing the risk of falls.  There is no current active infection.  Risks, benefits and expectations were discussed with the patient.  Risks including but not limited to the risk of anesthesia, blood clots, nerve damage, blood vessel damage, failure of the prosthesis, infection and up to and including death.  Patient understand the risks, benefits and expectations and wishes to proceed with surgery.   PCP: Nonnie DoneSlatosky, Rebecca J., MD  D/C Plans:       Home   Post-op Meds:       No Rx given   Tranexamic Acid:      To be given - IV   Decadron:      Is to be given  FYI:      ASA  Norco  DME:    Rx given for - RW   PT:     No PT   Patient Active Problem List   Diagnosis Date Noted  . 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 Obese 04/11/2013  . Expected blood loss anemia 04/11/2013  . 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 Anxiety    . Arthritis   . Complication of anesthesia   . Depression   . Fibromyalgia   . Headache   . Hypertension   . PONV (postoperative nausea and vomiting)   . 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 ABDOMINAL HYSTERECTOMY    . APPENDECTOMY     denies  . BACK SURGERY    . CHOLECYSTECTOMY    . TONSILLECTOMY    . TOTAL HIP ARTHROPLASTY Right 04/09/2013   Procedure: RIGHT TOTAL HIP ARTHROPLASTY ANTERIOR APPROACH;  Surgeon: Shelda PalMatthew D Olin, MD;  Location: [facility] ORS;  Service: Orthopedics;  Laterality: Right;  . TOTAL SHOULDER ARTHROPLASTY Left 07/31/2015   Procedure: TOTAL SHOULDER ARTHROPLASTY;  Surgeon: Beverely LowSteve Norris, MD;  [facility];  Service: Orthopedics;  Laterality: Left;  . TUBAL LIGATION      No current facility-administered medications for this encounter.    Current Outpatient Medications  Medication Sig Dispense Refill Last Dose  . ALPRAZolam (XANAX) 0.5 MG tablet Take 0.5 mg by mouth 3 (three) times daily as needed for anxiety.    07/31/2015 at 900  . amLODipine (NORVASC) 10 MG tablet Take 10 mg by mouth daily.   07/31/2015 at 900  . Ascorbic Acid (VITAMIN C PO) Take 1,000 mg by mouth daily.    Past Week  at Unknown time  . atenolol (TENORMIN) 50 MG tablet Take 50 mg by mouth daily.   07/31/2015 at 900  . Biotin 5000 MCG CAPS Take 5,000 mcg by mouth 1 day or 1 dose.   More than a month at Unknown time  . Coconut Oil 1000 MG CAPS Take 1,000 mg by mouth daily.   Past Month at Unknown time  . DULoxetine (CYMBALTA) 30 MG capsule Take 30 mg by mouth 2 (two) times daily.   07/31/2015 at 900  . lisinopril (PRINIVIL,ZESTRIL) 20 MG tablet Take 20 mg by mouth daily.   07/31/2015 at 900  . methocarbamol (ROBAXIN) 500 MG tablet Take 1 tablet (500 mg total) by mouth 3 (three) times daily as needed. 60 tablet 1   . Multiple Vitamin (MULTIVITAMIN) tablet Take 1 tablet by mouth daily.   Past Month at Unknown time  . oxyCODONE-acetaminophen (ROXICET)  5-325 MG tablet Take 1-2 tablets by mouth every 4 (four) hours as needed for severe pain. 60 tablet 0   . Probiotic Product (FORTIFY DAILY PROBIOTIC PO) Take 1 tablet by mouth daily.   Past Month at Unknown time   Allergies  Allergen Reactions  . Ancef [Cefazolin] Hives, Rash and Other (See Comments)    Allergic reaction post infusion had increased peak pressure, red rash,, and hives  . Cefaclor Hives    Rash also    Social History   Tobacco Use  . Smoking status: Former Smoker    Packs/day: 1.00    Years: 34.00    Pack years: 34.00    Types: Cigarettes    Last attempt to quit: 06/06/1998    Years since quitting: 20.0  Substance Use Topics  . Alcohol use: Yes    Comment: occassionally       Review of Systems  Constitutional: Positive for malaise/fatigue.  HENT: Negative.   Eyes: Negative.   Respiratory: Negative.   Cardiovascular: Negative.   Gastrointestinal: Negative.   Genitourinary: Negative.   Musculoskeletal: Positive for joint pain.  Skin: Negative.   Neurological: Positive for headaches.  Endo/Heme/Allergies: Negative.   Psychiatric/Behavioral: Positive for depression. The patient is nervous/anxious.     Objective:  Physical Exam  Constitutional: She is oriented to person, place, and time. She appears well-developed.  HENT:  Head: Normocephalic.  Eyes: Pupils are equal, round, and reactive to light.  Neck: Neck supple. No JVD present. No tracheal deviation present. No thyromegaly present.  Cardiovascular: Normal rate, regular rhythm and intact distal pulses.  Respiratory: Effort normal and breath sounds normal. No respiratory distress. She has no wheezes.  GI: Soft. There is no abdominal tenderness. There is no guarding.  Musculoskeletal:     Left hip: She exhibits decreased range of motion, decreased strength, tenderness and bony tenderness. She exhibits no swelling, no deformity and no laceration.  Lymphadenopathy:    She has no cervical adenopathy.   Neurological: She is alert and oriented to person, place, and time.  Skin: Skin is warm and dry.  Psychiatric: She has a normal mood and affect.      Labs:  Estimated body mass index is 34.86 kg/m as calculated from the following:   Height as of 07/28/15: 5\' 6"  (1.676 m).   Weight as of 07/31/15: 98 kg.   Imaging Review Plain radiographs demonstrate severe degenerative joint disease of the left hip. The bone quality appears to be good for age and reported activity level.    Preoperative templating of the joint replacement has been completed, documented,  and submitted to the Operating Room personnel in order to optimize intra-operative equipment management.     Assessment/Plan:  End stage arthritis, left hip  The patient history, physical examination, clinical judgement of the provider and imaging studies are consistent with end stage degenerative joint disease of the left hip and total hip arthroplasty is deemed medically necessary. The treatment options including medical management, injection therapy, arthroscopy and arthroplasty were discussed at length. The risks and benefits of total hip arthroplasty were presented and reviewed. The risks due to aseptic loosening, infection, stiffness, dislocation/subluxation,  thromboembolic complications and other imponderables were discussed.  The patient acknowledged the explanation, agreed to proceed with the plan and consent was signed. Patient is being admitted for inpatient treatment for surgery, pain control, PT, OT, prophylactic antibiotics, VTE prophylaxis, progressive ambulation and ADL's and discharge planning.The patient is planning to be discharged home.    Anastasio Auerbach Laquenta Whitsell   PA-C  06/01/2018, 1:50 PM

## 2018-06-05 ENCOUNTER — Encounter (HOSPITAL_COMMUNITY): Payer: Self-pay

## 2018-06-05 NOTE — Patient Instructions (Addendum)
Your procedure is scheduled on: Monday, Jan. 13, 2020   Surgery Time:  1:00PM-3:02PM   [facility]    Report to admitting at 10:30 AM   Call this number if you have problems the morning of surgery [phone]   Do not eat food:After Midnight.   May have liquids until 7:00AM day of surgery  CLEAR LIQUID DIET  Foods Allowed                                                                     Foods Excluded  Water, Black Coffee and tea, regular and decaf                             liquids that you cannot  Plain Jell-O in any flavor                                             see through such as: Fruit ices (not with fruit pulp)                                     milk, soups, orange juice  Iced Popsicles                                    All solid food Carbonated beverages, regular and diet                                    Cranberry, grape and apple juices Sports drinks like Gatorade Lightly seasoned clear broth or consume(fat free) Sugar, honey syrup  Sample Menu Breakfast                                Lunch                                     Supper Cranberry juice                    Beef broth                            Chicken broth Jell-O                                     Grape juice                           Apple juice Coffee or tea  Jell-O                                      Popsicle                                                Coffee or tea                        Coffee or tea    Brush your teeth the morning of surgery.   Do NOT smoke after Midnight   Take these medicines the morning of surgery with A SIP OF WATER:  Cymbalta,  Alprazolam if needed              You may not have any metal on your body including hair pins, jewelry, and body piercings             Do not wear make-up, lotions, powders, perfumes/cologne, or deodorant             Do not wear nail polish.  Do not shave  48 hours prior to  surgery.               Do not bring valuables to the hospital. [place] IS NOT             RESPONSIBLE   FOR VALUABLES.   Contacts, dentures or bridgework may not be worn into surgery.   Leave suitcase in the car. After surgery it may be brought to your room.   Special Instructions: Bring a copy of your healthcare power of attorney and living will documents         the day of surgery if you haven't scanned them in before.              Please read over the following fact sheets you were given:  [facility] - Preparing for Surgery Before surgery, you can play an important role.  Because skin is not sterile, your skin needs to be as free of germs as possible.  You can reduce the number of germs on your skin by washing with CHG (chlorahexidine gluconate) soap before surgery.  CHG is an antiseptic cleaner which kills germs and bonds with the skin to continue killing germs even after washing. Please DO NOT use if you have an allergy to CHG or antibacterial soaps.  If your skin becomes reddened/irritated stop using the CHG and inform your nurse when you arrive at Short Stay. Do not shave (including legs and underarms) for at least 48 hours prior to the first CHG shower.  You may shave your face/neck.  Please follow these instructions carefully:  1.  Shower with CHG Soap the night before surgery and the  morning of surgery.  2.  If you choose to wash your hair, wash your hair first as usual with your normal  shampoo.  3.  After you shampoo, rinse your hair and body thoroughly to remove the shampoo.                             4.  Use CHG as you would any other liquid soap.  You can apply chg directly to the skin and wash.  Gently with a scrungie or clean washcloth.  5.  Apply the CHG Soap to your body ONLY FROM THE NECK DOWN.   Do   not use on face/ open                           Wound or open sores. Avoid contact with eyes, ears mouth and   genitals (private parts).                       Wash  face,  Genitals (private parts) with your normal soap.             6.  Wash thoroughly, paying special attention to the area where your    surgery  will be performed.  7.  Thoroughly rinse your body with warm water from the neck down.  8.  DO NOT shower/wash with your normal soap after using and rinsing off the CHG Soap.                9.  Pat yourself dry with a clean towel.            10.  Wear clean pajamas.            11.  Place clean sheets on your bed the night of your first shower and do not  sleep with pets. Day of Surgery : Do not apply any lotions/deodorants the morning of surgery.  Please wear clean clothes to the hospital/surgery center.  FAILURE TO FOLLOW THESE INSTRUCTIONS MAY RESULT IN THE CANCELLATION OF YOUR SURGERY  PATIENT SIGNATURE_________________________________  NURSE SIGNATURE__________________________________  ________________________________________________________________________   Rogelia MireIncentive Spirometer  An incentive spirometer is a tool that can help keep your lungs clear and active. This tool measures how well you are filling your lungs with each breath. Taking long deep breaths may help reverse or decrease the chance of developing breathing (pulmonary) problems (especially infection) following:  A long period of time when you are unable to move or be active. BEFORE THE PROCEDURE   If the spirometer includes an indicator to show your best effort, your nurse or respiratory therapist will set it to a desired goal.  If possible, sit up straight or lean slightly forward. Try not to slouch.  Hold the incentive spirometer in an upright position. INSTRUCTIONS FOR USE  1. Sit on the edge of your bed if possible, or sit up as far as you can in bed or on a chair. 2. Hold the incentive spirometer in an upright position. 3. Breathe out normally. 4. Place the mouthpiece in your mouth and seal your lips tightly around it. 5. Breathe in slowly and as deeply as  possible, raising the piston or the ball toward the top of the column. 6. Hold your breath for 3-5 seconds or for as long as possible. Allow the piston or ball to fall to the bottom of the column. 7. Remove the mouthpiece from your mouth and breathe out normally. 8. Rest for a few seconds and repeat Steps 1 through 7 at least 10 times every 1-2 hours when you are awake. Take your time and take a few normal breaths between deep breaths. 9. The spirometer may include an indicator to show your best effort. Use the indicator as a goal to work toward during each repetition. 10. After each set of 10 deep breaths, practice coughing to be sure your lungs are clear. If you have an incision (  the cut made at the time of surgery), support your incision when coughing by placing a pillow or rolled up towels firmly against it. Once you are able to get out of bed, walk around indoors and cough well. You may stop using the incentive spirometer when instructed by your caregiver.  RISKS AND COMPLICATIONS  Take your time so you do not get dizzy or light-headed.  If you are in pain, you may need to take or ask for pain medication before doing incentive spirometry. It is harder to take a deep breath if you are having pain. AFTER USE  Rest and breathe slowly and easily.  It can be helpful to keep track of a log of your progress. Your caregiver can provide you with a simple table to help with this. If you are using the spirometer at home, follow these instructions: SEEK MEDICAL CARE IF:   You are having difficultly using the spirometer.  You have trouble using the spirometer as often as instructed.  Your pain medication is not giving enough relief while using the spirometer.  You develop fever of 100.5 F (38.1 C) or higher. SEEK IMMEDIATE MEDICAL CARE IF:   You cough up bloody sputum that had not been present before.  You develop fever of 102 F (38.9 C) or greater.  You develop worsening pain at or near  the incision site. MAKE SURE YOU:   Understand these instructions.  Will watch your condition.  Will get help right away if you are not doing well or get worse. Document Released: 10/03/2006 Document Revised: 08/15/2011 Document Reviewed: 12/04/2006 Exit[employer] Patient Information 2014 ExitCare, [place].   ________________________________________________________________________  WHAT IS A BLOOD TRANSFUSION? Blood Transfusion Information  A transfusion is the replacement of blood or some of its parts. Blood is made up of multiple cells which provide different functions.  Red blood cells carry oxygen and are used for blood loss replacement.  White blood cells fight against infection.  Platelets control bleeding.  Plasma helps clot blood.  Other blood products are available for specialized needs, such as hemophilia or other clotting disorders. BEFORE THE TRANSFUSION  Who gives blood for transfusions?   Healthy volunteers who are fully evaluated to make sure their blood is safe. This is blood bank blood. Transfusion therapy is the safest it has ever been in the practice of medicine. Before blood is taken from a donor, a complete history is taken to make sure that person has no history of diseases nor engages in risky social behavior (examples are intravenous drug use or sexual activity with multiple partners). The donor's travel history is screened to minimize risk of transmitting infections, such as malaria. The donated blood is tested for signs of infectious diseases, such as HIV and hepatitis. The blood is then tested to be sure it is compatible with you in order to minimize the chance of a transfusion reaction. If you or a relative donates blood, this is often done in anticipation of surgery and is not appropriate for emergency situations. It takes many days to process the donated blood. RISKS AND COMPLICATIONS Although transfusion therapy is very safe and saves many lives, the main  dangers of transfusion include:   Getting an infectious disease.  Developing a transfusion reaction. This is an allergic reaction to something in the blood you were given. Every precaution is taken to prevent this. The decision to have a blood transfusion has been considered carefully by your caregiver before blood is given. Blood is not given unless  the benefits outweigh the risks. AFTER THE TRANSFUSION  Right after receiving a blood transfusion, you will usually feel much better and more energetic. This is especially true if your red blood cells have gotten low (anemic). The transfusion raises the level of the red blood cells which carry oxygen, and this usually causes an energy increase.  The nurse administering the transfusion will monitor you carefully for complications. HOME CARE INSTRUCTIONS  No special instructions are needed after a transfusion. You may find your energy is better. Speak with your caregiver about any limitations on activity for underlying diseases you may have. SEEK MEDICAL CARE IF:   Your condition is not improving after your transfusion.  You develop redness or irritation at the intravenous (IV) site. SEEK IMMEDIATE MEDICAL CARE IF:  Any of the following symptoms occur over the next 12 hours:  Shaking chills.  You have a temperature by mouth above 102 F (38.9 C), not controlled by medicine.  Chest, back, or muscle pain.  People around you feel you are not acting correctly or are confused.  Shortness of breath or difficulty breathing.  Dizziness and fainting.  You get a rash or develop hives.  You have a decrease in urine output.  Your urine turns a dark color or changes to pink, red, or brown. Any of the following symptoms occur over the next 10 days:  You have a temperature by mouth above 102 F (38.9 C), not controlled by medicine.  Shortness of breath.  Weakness after normal activity.  The white part of the eye turns yellow  (jaundice).  You have a decrease in the amount of urine or are urinating less often.  Your urine turns a dark color or changes to pink, red, or brown. Document Released: 05/20/2000 Document Revised: 08/15/2011 Document Reviewed: 01/07/2008 [facility] Patient Information 2014 [place], [place].  _______________________________________________________________________

## 2018-06-11 ENCOUNTER — Encounter (HOSPITAL_COMMUNITY)
Admission: RE | Admit: 2018-06-11 | Discharge: 2018-06-11 | Disposition: A | Discharge status: Home / Self Care | Payer: Medicare HMO | Source: Ambulatory Visit | Attending: Orthopedic Surgery | Admitting: Orthopedic Surgery

## 2018-06-11 ENCOUNTER — Other Ambulatory Visit: Payer: Self-pay

## 2018-06-11 ENCOUNTER — Encounter (HOSPITAL_COMMUNITY): Payer: Self-pay

## 2018-06-11 DIAGNOSIS — M1612 Unilateral primary osteoarthritis, left hip: Secondary | ICD-10-CM | POA: Insufficient documentation

## 2018-06-11 DIAGNOSIS — Z01818 Encounter for other preprocedural examination: Secondary | ICD-10-CM | POA: Insufficient documentation

## 2018-06-11 HISTORY — DX: Other intervertebral disc degeneration, lumbar region: M51.36

## 2018-06-11 HISTORY — DX: Spinal stenosis, lumbar region without neurogenic claudication: M48.061

## 2018-06-11 HISTORY — DX: Other intervertebral disc degeneration, lumbar region without mention of lumbar back pain or lower extremity pain: M51.369

## 2018-06-11 HISTORY — DX: Other intervertebral disc displacement, lumbar region: M51.26

## 2018-06-11 HISTORY — DX: Personal history of other infectious and parasitic diseases: Z86.19

## 2018-06-11 LAB — BASIC METABOLIC PANEL
Anion gap: 9 (ref 5–15)
BUN: 14 mg/dL (ref 8–23)
CO2: 29 mmol/L (ref 22–32)
Calcium: 9.6 mg/dL (ref 8.9–10.3)
Chloride: 105 mmol/L (ref 98–111)
Creatinine, Ser: 0.68 mg/dL (ref 0.44–1.00)
GFR calc Af Amer: >60 mL/min (ref >60)
GFR calc non Af Amer: >60 mL/min (ref >60)
Glucose, Bld: 97 mg/dL (ref 70–99)
Potassium: 3.8 mmol/L (ref 3.5–5.1)
Sodium: 143 mmol/L (ref 135–145)

## 2018-06-11 LAB — CBC
HCT: 43.3 % (ref 36.0–46.0)
Hemoglobin: 13.8 g/dL (ref 12.0–15.0)
MCH: 27.9 pg (ref 26.0–34.0)
MCHC: 31.9 g/dL (ref 30.0–36.0)
MCV: 87.5 fL (ref 80.0–100.0)
Platelets: 339 10*3/uL (ref 150–400)
RBC: 4.95 MIL/uL (ref 3.87–5.11)
RDW: 14 % (ref 11.5–15.5)
WBC: 10.4 10*3/uL (ref 4.0–10.5)
nRBC: 0 % (ref 0.0–0.2)

## 2018-06-11 LAB — SURGICAL PCR SCREEN
MRSA, PCR: NEGATIVE
Staphylococcus aureus: NEGATIVE

## 2018-06-15 NOTE — Pre-Procedure Instructions (Signed)
Surgical clearance Dr. Cheri Rous 06/14/2018 placed in hard chart.

## 2018-06-17 MED ORDER — GENTAMICIN SULFATE 40 MG/ML IJ SOLN
380.0000 mg | INTRAVENOUS | Status: DC
  Administered 2018-06-18: 380 mg via INTRAVENOUS
  Filled 2018-06-17: qty 9.5

## 2018-06-18 ENCOUNTER — Encounter (HOSPITAL_COMMUNITY): Payer: Self-pay | Admitting: *Deleted

## 2018-06-18 ENCOUNTER — Other Ambulatory Visit: Payer: Self-pay

## 2018-06-18 ENCOUNTER — Inpatient Hospital Stay (HOSPITAL_COMMUNITY): Payer: Medicare HMO | Admitting: Physician Assistant

## 2018-06-18 ENCOUNTER — Inpatient Hospital Stay (HOSPITAL_COMMUNITY): Payer: Medicare HMO

## 2018-06-18 ENCOUNTER — Encounter (HOSPITAL_COMMUNITY)
Admission: RE | Disposition: A | Discharge status: Home / Self Care | Payer: Self-pay | Source: Home / Self Care | Attending: Orthopedic Surgery

## 2018-06-18 ENCOUNTER — Inpatient Hospital Stay (HOSPITAL_COMMUNITY): Payer: Medicare HMO | Admitting: Anesthesiology

## 2018-06-18 ENCOUNTER — Inpatient Hospital Stay (HOSPITAL_COMMUNITY)
Admission: RE | Admit: 2018-06-18 | Discharge: 2018-06-19 | DRG: 470 | Disposition: A | Discharge status: Home / Self Care | Payer: Medicare HMO | Attending: Orthopedic Surgery | Admitting: Orthopedic Surgery

## 2018-06-18 DIAGNOSIS — M25552 Pain in left hip: Secondary | ICD-10-CM | POA: Diagnosis present

## 2018-06-18 DIAGNOSIS — Z888 Allergy status to other drugs, medicaments and biological substances status: Secondary | ICD-10-CM | POA: Diagnosis not present

## 2018-06-18 DIAGNOSIS — F329 Major depressive disorder, single episode, unspecified: Secondary | ICD-10-CM | POA: Diagnosis present

## 2018-06-18 DIAGNOSIS — Z79899 Other long term (current) drug therapy: Secondary | ICD-10-CM | POA: Diagnosis not present

## 2018-06-18 DIAGNOSIS — Z87891 Personal history of nicotine dependence: Secondary | ICD-10-CM | POA: Diagnosis not present

## 2018-06-18 DIAGNOSIS — E669 Obesity, unspecified: Secondary | ICD-10-CM | POA: Diagnosis present

## 2018-06-18 DIAGNOSIS — Z419 Encounter for procedure for purposes other than remedying health state, unspecified: Secondary | ICD-10-CM

## 2018-06-18 DIAGNOSIS — Z96642 Presence of left artificial hip joint: Secondary | ICD-10-CM

## 2018-06-18 DIAGNOSIS — F419 Anxiety disorder, unspecified: Secondary | ICD-10-CM | POA: Diagnosis present

## 2018-06-18 DIAGNOSIS — M5136 Other intervertebral disc degeneration, lumbar region: Secondary | ICD-10-CM | POA: Diagnosis present

## 2018-06-18 DIAGNOSIS — M1612 Unilateral primary osteoarthritis, left hip: Principal | ICD-10-CM | POA: Diagnosis present

## 2018-06-18 DIAGNOSIS — I10 Essential (primary) hypertension: Secondary | ICD-10-CM | POA: Diagnosis present

## 2018-06-18 DIAGNOSIS — M797 Fibromyalgia: Secondary | ICD-10-CM | POA: Diagnosis present

## 2018-06-18 DIAGNOSIS — Z96619 Presence of unspecified artificial shoulder joint: Secondary | ICD-10-CM | POA: Diagnosis present

## 2018-06-18 DIAGNOSIS — Z6835 Body mass index (BMI) 35.0-35.9, adult: Secondary | ICD-10-CM

## 2018-06-18 DIAGNOSIS — Z96641 Presence of right artificial hip joint: Secondary | ICD-10-CM | POA: Diagnosis present

## 2018-06-18 DIAGNOSIS — Z96649 Presence of unspecified artificial hip joint: Secondary | ICD-10-CM

## 2018-06-18 HISTORY — PX: TOTAL HIP ARTHROPLASTY: SHX124

## 2018-06-18 HISTORY — DX: Presence of left artificial hip joint: Z96.642

## 2018-06-18 LAB — TYPE AND SCREEN
ABO/RH(D): B POS
Antibody Screen: NEGATIVE

## 2018-06-18 SURGERY — ARTHROPLASTY, HIP, TOTAL, ANTERIOR APPROACH
Anesthesia: General | Site: Hip | Laterality: Left

## 2018-06-18 MED ORDER — VANCOMYCIN HCL IN DEXTROSE 1-5 GM/200ML-% IV SOLN
1000.0000 mg | Freq: Two times a day (BID) | INTRAVENOUS | Status: AC
  Administered 2018-06-18: 1000 mg via INTRAVENOUS
  Filled 2018-06-18: qty 200

## 2018-06-18 MED ORDER — MAGNESIUM CITRATE PO SOLN: 1.0000 | Freq: Once | ORAL | Status: DC | PRN

## 2018-06-18 MED ORDER — FENTANYL CITRATE (PF) 100 MCG/2ML IJ SOLN
INTRAMUSCULAR | Status: AC
  Filled 2018-06-18: qty 2

## 2018-06-18 MED ORDER — DOCUSATE SODIUM 100 MG PO CAPS
100.0000 mg | ORAL_CAPSULE | Freq: Two times a day (BID) | ORAL | Status: DC
  Administered 2018-06-18 – 2018-06-19 (×2): 100 mg via ORAL
  Filled 2018-06-18 (×2): qty 1

## 2018-06-18 MED ORDER — LACTATED RINGERS IV SOLN
INTRAVENOUS | Status: DC
  Administered 2018-06-18 (×2): via INTRAVENOUS

## 2018-06-18 MED ORDER — PHENOL 1.4 % MT LIQD: 1.0000 | OROMUCOSAL | Status: DC | PRN

## 2018-06-18 MED ORDER — TRANEXAMIC ACID-NACL 1000-0.7 MG/100ML-% IV SOLN
1000.0000 mg | Freq: Once | INTRAVENOUS | Status: AC
  Administered 2018-06-18: 1000 mg via INTRAVENOUS
  Filled 2018-06-18: qty 100

## 2018-06-18 MED ORDER — FENTANYL CITRATE (PF) 100 MCG/2ML IJ SOLN
25.0000 ug | INTRAMUSCULAR | Status: DC | PRN
  Administered 2018-06-18 (×3): 50 ug via INTRAVENOUS

## 2018-06-18 MED ORDER — SUGAMMADEX SODIUM 200 MG/2ML IV SOLN
INTRAVENOUS | Status: DC | PRN
  Administered 2018-06-18: 200 mg via INTRAVENOUS

## 2018-06-18 MED ORDER — ACETAMINOPHEN 325 MG PO TABS: 325.0000 mg | ORAL_TABLET | Freq: Four times a day (QID) | ORAL | Status: DC | PRN

## 2018-06-18 MED ORDER — HYDROCODONE-ACETAMINOPHEN 7.5-325 MG PO TABS: 1.0000 | ORAL_TABLET | ORAL | 0 refills | Status: DC | PRN

## 2018-06-18 MED ORDER — PROPOFOL 10 MG/ML IV BOLUS
INTRAVENOUS | Status: DC | PRN
  Administered 2018-06-18: 150 mg via INTRAVENOUS

## 2018-06-18 MED ORDER — DEXAMETHASONE SODIUM PHOSPHATE 10 MG/ML IJ SOLN
10.0000 mg | Freq: Once | INTRAMUSCULAR | Status: AC
  Administered 2018-06-19: 10 mg via INTRAVENOUS
  Filled 2018-06-18: qty 1

## 2018-06-18 MED ORDER — FENTANYL CITRATE (PF) 100 MCG/2ML IJ SOLN
INTRAMUSCULAR | Status: DC | PRN
  Administered 2018-06-18: 100 ug via INTRAVENOUS
  Administered 2018-06-18 (×3): 50 ug via INTRAVENOUS
  Administered 2018-06-18: 100 ug via INTRAVENOUS

## 2018-06-18 MED ORDER — TRANEXAMIC ACID-NACL 1000-0.7 MG/100ML-% IV SOLN
1000.0000 mg | INTRAVENOUS | Status: DC
  Filled 2018-06-18: qty 100

## 2018-06-18 MED ORDER — FENTANYL CITRATE (PF) 100 MCG/2ML IJ SOLN: 25.0000 ug | INTRAMUSCULAR | Status: DC | PRN

## 2018-06-18 MED ORDER — CHLORHEXIDINE GLUCONATE 4 % EX LIQD: 60.0000 mL | Freq: Once | CUTANEOUS | Status: DC

## 2018-06-18 MED ORDER — HYDROMORPHONE HCL 1 MG/ML IJ SOLN: 0.5000 mg | INTRAMUSCULAR | Status: DC | PRN

## 2018-06-18 MED ORDER — ASPIRIN 81 MG PO CHEW: 81.0000 mg | CHEWABLE_TABLET | Freq: Two times a day (BID) | ORAL | 0 refills | Status: AC

## 2018-06-18 MED ORDER — DULOXETINE HCL 30 MG PO CPEP
30.0000 mg | ORAL_CAPSULE | Freq: Two times a day (BID) | ORAL | Status: DC
  Administered 2018-06-18 – 2018-06-19 (×2): 30 mg via ORAL
  Filled 2018-06-18 (×2): qty 1

## 2018-06-18 MED ORDER — PROPOFOL 10 MG/ML IV BOLUS
INTRAVENOUS | Status: AC
  Filled 2018-06-18: qty 20

## 2018-06-18 MED ORDER — MEPERIDINE HCL 50 MG/ML IJ SOLN: 6.2500 mg | INTRAMUSCULAR | Status: DC | PRN

## 2018-06-18 MED ORDER — BISACODYL 10 MG RE SUPP: 10.0000 mg | Freq: Every day | RECTAL | Status: DC | PRN

## 2018-06-18 MED ORDER — HYDROCODONE-ACETAMINOPHEN 5-325 MG PO TABS
1.0000 | ORAL_TABLET | ORAL | Status: DC | PRN
  Administered 2018-06-18 – 2018-06-19 (×4): 2 via ORAL
  Filled 2018-06-18 (×4): qty 2

## 2018-06-18 MED ORDER — FENTANYL CITRATE (PF) 250 MCG/5ML IJ SOLN
INTRAMUSCULAR | Status: AC
  Filled 2018-06-18: qty 5

## 2018-06-18 MED ORDER — METHOCARBAMOL 500 MG IVPB - SIMPLE MED
INTRAVENOUS | Status: AC
  Filled 2018-06-18: qty 50

## 2018-06-18 MED ORDER — SCOPOLAMINE 1 MG/3DAYS TD PT72
MEDICATED_PATCH | TRANSDERMAL | Status: DC | PRN
  Administered 2018-06-18: 1 via TRANSDERMAL

## 2018-06-18 MED ORDER — ONDANSETRON HCL 4 MG/2ML IJ SOLN: 4.0000 mg | Freq: Four times a day (QID) | INTRAMUSCULAR | Status: DC | PRN

## 2018-06-18 MED ORDER — DEXAMETHASONE SODIUM PHOSPHATE 10 MG/ML IJ SOLN
10.0000 mg | Freq: Once | INTRAMUSCULAR | Status: AC
  Administered 2018-06-18: 10 mg via INTRAVENOUS

## 2018-06-18 MED ORDER — SUGAMMADEX SODIUM 200 MG/2ML IV SOLN
INTRAVENOUS | Status: AC
  Filled 2018-06-18: qty 2

## 2018-06-18 MED ORDER — MENTHOL 3 MG MT LOZG: 1.0000 | LOZENGE | OROMUCOSAL | Status: DC | PRN

## 2018-06-18 MED ORDER — LIDOCAINE HCL (CARDIAC) PF 100 MG/5ML IV SOSY
PREFILLED_SYRINGE | INTRAVENOUS | Status: DC | PRN
  Administered 2018-06-18: 60 mg via INTRAVENOUS

## 2018-06-18 MED ORDER — METHOCARBAMOL 500 MG IVPB - SIMPLE MED
500.0000 mg | Freq: Four times a day (QID) | INTRAVENOUS | Status: DC | PRN
  Administered 2018-06-18: 500 mg via INTRAVENOUS
  Filled 2018-06-18: qty 50

## 2018-06-18 MED ORDER — STERILE WATER FOR IRRIGATION IR SOLN
Status: DC | PRN
  Administered 2018-06-18: 2000 mL

## 2018-06-18 MED ORDER — METOCLOPRAMIDE HCL 5 MG/ML IJ SOLN: 10.0000 mg | Freq: Once | INTRAMUSCULAR | Status: DC | PRN

## 2018-06-18 MED ORDER — FERROUS SULFATE 325 (65 FE) MG PO TABS
325.0000 mg | ORAL_TABLET | Freq: Three times a day (TID) | ORAL | Status: DC
  Administered 2018-06-18 – 2018-06-19 (×3): 325 mg via ORAL
  Filled 2018-06-18 (×3): qty 1

## 2018-06-18 MED ORDER — MIDAZOLAM HCL 5 MG/5ML IJ SOLN
INTRAMUSCULAR | Status: DC | PRN
  Administered 2018-06-18: 2 mg via INTRAVENOUS

## 2018-06-18 MED ORDER — METHOCARBAMOL 500 MG PO TABS: 500.0000 mg | ORAL_TABLET | Freq: Four times a day (QID) | ORAL | 0 refills | Status: DC | PRN

## 2018-06-18 MED ORDER — ROCURONIUM BROMIDE 100 MG/10ML IV SOLN
INTRAVENOUS | Status: DC | PRN
  Administered 2018-06-18: 10 mg via INTRAVENOUS
  Administered 2018-06-18: 50 mg via INTRAVENOUS

## 2018-06-18 MED ORDER — FERROUS SULFATE 325 (65 FE) MG PO TABS: 325.0000 mg | ORAL_TABLET | Freq: Three times a day (TID) | ORAL | 3 refills | Status: DC

## 2018-06-18 MED ORDER — ONDANSETRON HCL 4 MG PO TABS: 4.0000 mg | ORAL_TABLET | Freq: Four times a day (QID) | ORAL | Status: DC | PRN

## 2018-06-18 MED ORDER — ALUM & MAG HYDROXIDE-SIMETH 200-200-20 MG/5ML PO SUSP: 15.0000 mL | ORAL | Status: DC | PRN

## 2018-06-18 MED ORDER — ONDANSETRON HCL 4 MG/2ML IJ SOLN
INTRAMUSCULAR | Status: DC | PRN
  Administered 2018-06-18: 4 mg via INTRAVENOUS

## 2018-06-18 MED ORDER — HYDROCODONE-ACETAMINOPHEN 7.5-325 MG PO TABS
1.0000 | ORAL_TABLET | ORAL | Status: DC | PRN
  Filled 2018-06-18: qty 2

## 2018-06-18 MED ORDER — VANCOMYCIN HCL IN DEXTROSE 1-5 GM/200ML-% IV SOLN
1000.0000 mg | INTRAVENOUS | Status: AC
  Administered 2018-06-18: 1000 mg via INTRAVENOUS
  Filled 2018-06-18: qty 200

## 2018-06-18 MED ORDER — METHOCARBAMOL 500 MG PO TABS
500.0000 mg | ORAL_TABLET | Freq: Four times a day (QID) | ORAL | Status: DC | PRN
  Administered 2018-06-19: 500 mg via ORAL
  Filled 2018-06-18: qty 1

## 2018-06-18 MED ORDER — MIDAZOLAM HCL 2 MG/2ML IJ SOLN
INTRAMUSCULAR | Status: AC
  Filled 2018-06-18: qty 2

## 2018-06-18 MED ORDER — CELECOXIB 200 MG PO CAPS
200.0000 mg | ORAL_CAPSULE | Freq: Two times a day (BID) | ORAL | Status: DC
  Administered 2018-06-18 – 2018-06-19 (×2): 200 mg via ORAL
  Filled 2018-06-18 (×2): qty 1

## 2018-06-18 MED ORDER — SODIUM CHLORIDE 0.9 % IR SOLN
Status: DC | PRN
  Administered 2018-06-18: 1000 mL

## 2018-06-18 MED ORDER — POLYETHYLENE GLYCOL 3350 17 G PO PACK
17.0000 g | PACK | Freq: Two times a day (BID) | ORAL | Status: DC
  Administered 2018-06-18 – 2018-06-19 (×2): 17 g via ORAL
  Filled 2018-06-18: qty 1

## 2018-06-18 MED ORDER — ATENOLOL 50 MG PO TABS
50.0000 mg | ORAL_TABLET | Freq: Every day | ORAL | Status: DC
  Administered 2018-06-18: 50 mg via ORAL
  Filled 2018-06-18 (×2): qty 1

## 2018-06-18 MED ORDER — SCOPOLAMINE 1 MG/3DAYS TD PT72
MEDICATED_PATCH | TRANSDERMAL | Status: AC
  Filled 2018-06-18: qty 1

## 2018-06-18 MED ORDER — METOCLOPRAMIDE HCL 5 MG/ML IJ SOLN: 5.0000 mg | Freq: Three times a day (TID) | INTRAMUSCULAR | Status: DC | PRN

## 2018-06-18 MED ORDER — DIPHENHYDRAMINE HCL 12.5 MG/5ML PO ELIX: 12.5000 mg | ORAL_SOLUTION | ORAL | Status: DC | PRN

## 2018-06-18 MED ORDER — ALPRAZOLAM 0.5 MG PO TABS: 0.5000 mg | ORAL_TABLET | Freq: Three times a day (TID) | ORAL | Status: DC | PRN

## 2018-06-18 MED ORDER — SODIUM CHLORIDE 0.9 % IV SOLN
INTRAVENOUS | Status: DC
  Administered 2018-06-18 – 2018-06-19 (×2): via INTRAVENOUS

## 2018-06-18 MED ORDER — POLYETHYLENE GLYCOL 3350 17 G PO PACK: 17.0000 g | PACK | Freq: Two times a day (BID) | ORAL | 0 refills | Status: DC

## 2018-06-18 MED ORDER — ONDANSETRON HCL 4 MG/2ML IJ SOLN
INTRAMUSCULAR | Status: AC
  Filled 2018-06-18: qty 2

## 2018-06-18 MED ORDER — AMLODIPINE BESYLATE 10 MG PO TABS
10.0000 mg | ORAL_TABLET | Freq: Every day | ORAL | Status: DC
  Administered 2018-06-18: 10 mg via ORAL
  Filled 2018-06-18 (×2): qty 1

## 2018-06-18 MED ORDER — DOCUSATE SODIUM 100 MG PO CAPS: 100.0000 mg | ORAL_CAPSULE | Freq: Two times a day (BID) | ORAL | 0 refills | Status: DC

## 2018-06-18 MED ORDER — METOCLOPRAMIDE HCL 5 MG PO TABS: 5.0000 mg | ORAL_TABLET | Freq: Three times a day (TID) | ORAL | Status: DC | PRN

## 2018-06-18 MED ORDER — ASPIRIN 81 MG PO CHEW
81.0000 mg | CHEWABLE_TABLET | Freq: Two times a day (BID) | ORAL | Status: DC
  Administered 2018-06-18 – 2018-06-19 (×2): 81 mg via ORAL
  Filled 2018-06-18 (×2): qty 1

## 2018-06-18 SURGICAL SUPPLY — 46 items
BAG DECANTER FOR FLEXI CONT (MISCELLANEOUS) IMPLANT
BAG ZIPLOCK 12X15 (MISCELLANEOUS) IMPLANT
BALL HIP CERAMIC (Hips) ×1 IMPLANT
BLADE SAG 18X100X1.27 (BLADE) ×3 IMPLANT
BLADE SURG SZ10 CARB STEEL (BLADE) ×6 IMPLANT
COVER PERINEAL POST (MISCELLANEOUS) ×3 IMPLANT
COVER SURGICAL LIGHT HANDLE (MISCELLANEOUS) ×3 IMPLANT
COVER WAND RF STERILE (DRAPES) ×3 IMPLANT
CUP ACET PINNACLE SECTR 50MM (Hips) ×1 IMPLANT
DERMABOND ADVANCED (GAUZE/BANDAGES/DRESSINGS) ×2
DERMABOND ADVANCED .7 DNX12 (GAUZE/BANDAGES/DRESSINGS) ×1 IMPLANT
DRAPE STERI IOBAN 125X83 (DRAPES) ×3 IMPLANT
DRAPE U-SHAPE 47X51 STRL (DRAPES) ×6 IMPLANT
DRESSING AQUACEL AG SP 3.5X10 (GAUZE/BANDAGES/DRESSINGS) ×1 IMPLANT
DRSG AQUACEL AG ADV 3.5X10 (GAUZE/BANDAGES/DRESSINGS) ×3 IMPLANT
DRSG AQUACEL AG SP 3.5X10 (GAUZE/BANDAGES/DRESSINGS) ×3
DURAPREP 26ML APPLICATOR (WOUND CARE) ×3 IMPLANT
ELECT REM PT RETURN 15FT ADLT (MISCELLANEOUS) ×3 IMPLANT
ELIMINATOR HOLE APEX DEPUY (Hips) ×3 IMPLANT
GLOVE BIOGEL M STRL SZ7.5 (GLOVE) IMPLANT
GLOVE BIOGEL PI IND STRL 7.5 (GLOVE) ×1 IMPLANT
GLOVE BIOGEL PI IND STRL 8.5 (GLOVE) ×1 IMPLANT
GLOVE BIOGEL PI INDICATOR 7.5 (GLOVE) ×2
GLOVE BIOGEL PI INDICATOR 8.5 (GLOVE) ×2
GLOVE ECLIPSE 8.0 STRL XLNG CF (GLOVE) ×6 IMPLANT
GLOVE ORTHO TXT STRL SZ7.5 (GLOVE) ×3 IMPLANT
GOWN STRL REUS W/TWL 2XL LVL3 (GOWN DISPOSABLE) ×3 IMPLANT
GOWN STRL REUS W/TWL LRG LVL3 (GOWN DISPOSABLE) ×3 IMPLANT
HIP BALL CERAMIC (Hips) ×3 IMPLANT
HOLDER FOLEY CATH W/STRAP (MISCELLANEOUS) ×3 IMPLANT
LINER ACET PNNCL PLUS4 NEUTRAL (Hips) ×1 IMPLANT
PACK ANTERIOR HIP CUSTOM (KITS) ×3 IMPLANT
PINNACLE PLUS 4 NEUTRAL (Hips) ×3 IMPLANT
PINNACLE SECTOR CUP 50MM (Hips) ×3 IMPLANT
SCREW 6.5MMX30MM (Screw) ×3 IMPLANT
STEM TRI LOC BPS SZ3 W GRIPTON ×1 IMPLANT
SUT MNCRL AB 4-0 PS2 18 (SUTURE) ×3 IMPLANT
SUT STRATAFIX 0 PDS 27 VIOLET (SUTURE) ×3
SUT VIC AB 1 CT1 36 (SUTURE) ×9 IMPLANT
SUT VIC AB 2-0 CT1 27 (SUTURE) ×4
SUT VIC AB 2-0 CT1 TAPERPNT 27 (SUTURE) ×2 IMPLANT
SUTURE STRATFX 0 PDS 27 VIOLET (SUTURE) ×1 IMPLANT
TRAY FOLEY MTR SLVR 16FR STAT (SET/KITS/TRAYS/PACK) ×3 IMPLANT
TRI LOC BPS SZ 3 W GRIPTON ×3 IMPLANT
WATER STERILE IRR 1000ML POUR (IV SOLUTION) ×3 IMPLANT
YANKAUER SUCT BULB TIP 10FT TU (MISCELLANEOUS) IMPLANT

## 2018-06-18 NOTE — Anesthesia Preprocedure Evaluation (Addendum)
Anesthesia Evaluation  Patient identified by MRN, date of birth, ID band Patient awake    Reviewed: Allergy & Precautions, H&P , NPO status , Patient's Chart, lab work & pertinent test results, reviewed documented beta blocker date and time   History of Anesthesia Complications (+) PONV  Airway Mallampati: II  TM Distance: >3 FB Neck ROM: full    Dental no notable dental hx. (+) Teeth Intact, Dental Advisory Given   Pulmonary former smoker,  Stop bang - OSA   Pulmonary exam normal breath sounds clear to auscultation       Cardiovascular Exercise Tolerance: Good hypertension, Pt. on medications and Pt. on home beta blockers Normal cardiovascular exam Rhythm:regular Rate:Normal     Neuro/Psych Anxiety Depression Back surgery spinal stenosis negative neurological ROS     GI/Hepatic negative GI ROS, Neg liver ROS,   Endo/Other  negative endocrine ROS  Renal/GU negative Renal ROS  negative genitourinary   Musculoskeletal  (+) Fibromyalgia -  Abdominal   Peds  Hematology negative hematology ROS (+)   Anesthesia Other Findings   Reproductive/Obstetrics negative OB ROS                             Anesthesia Physical  Anesthesia Plan  ASA: II  Anesthesia Plan: General   Post-op Pain Management:    Induction:   PONV Risk Score and Plan: 4 or greater and Ondansetron, Dexamethasone, Midazolam, Scopolamine patch - Pre-op and Treatment may vary due to age or medical condition  Airway Management Planned: Oral ETT  Additional Equipment:   Intra-op Plan:   Post-operative Plan: Extubation in OR  Informed Consent: I have reviewed the patients History and Physical, chart, labs and discussed the procedure including the risks, benefits and alternatives for the proposed anesthesia with the patient or authorized representative who has indicated his/her understanding and acceptance.   Dental  Advisory Given  Plan Discussed with: CRNA and Surgeon  Anesthesia Plan Comments: (H/o spinal stenosis)        Anesthesia Quick Evaluation

## 2018-06-18 NOTE — Op Note (Signed)
NAME:  Rebecca Morse                ACCOUNT NO.: [id number]      MEDICAL RECORD NO.: [id number]      FACILITY:  [facility]      PHYSICIAN:  Shelda Pal  DATE OF BIRTH:  May 20, 1951     DATE OF PROCEDURE:  06/18/2018                                 OPERATIVE REPORT         PREOPERATIVE DIAGNOSIS: Left  hip osteoarthritis.      POSTOPERATIVE DIAGNOSIS:  Left hip osteoarthritis.      PROCEDURE:  Left total hip replacement through an anterior approach   utilizing DePuy THR system, component size 50mm pinnacle cup, a size 32+4 neutral   Altrex liner, a size 3 hi Tri Lock stem with a 32+5 delta ceramic   ball.      SURGEON:  Madlyn Frankel. Charlann Boxer, M.D.      ASSISTANT:  Dennie Bible, PA-C     ANESTHESIA:  Spinal.      SPECIMENS:  None.      COMPLICATIONS:  None.      BLOOD LOSS:  800 cc     DRAINS:  None.      INDICATION OF THE PROCEDURE:  Rebecca Morse is a 68 y.o. female who had   presented to office for evaluation of left hip pain.  Radiographs revealed   progressive degenerative changes with bone-on-bone   articulation of the  hip joint, including subchondral cystic changes and osteophytes.  The patient had painful limited range of   motion significantly affecting their overall quality of life and function.  The patient was failing to    respond to conservative measures including medications and/or injections and activity modification and at this point was ready   to proceed with more definitive measures.  Consent was obtained for   benefit of pain relief.  Specific risks of infection, DVT, component   failure, dislocation, neurovascular injury, and need for revision surgery were reviewed in the office as well discussion of   the anterior versus posterior approach were reviewed.     PROCEDURE IN DETAIL:  The patient was brought to operative theater.   Once adequate anesthesia, preoperative antibiotics, 2 gm of Ancef, 1 gm of Tranexamic Acid, and  10 mg of Decadron were administered, the patient was positioned supine on the [employer] table.  Once the patient was safely positioned with adequate padding of boney prominences we predraped out the hip, and used fluoroscopy to confirm orientation of the pelvis.      The left hip was then prepped and draped from proximal iliac crest to   mid thigh with a shower curtain technique.      Time-out was performed identifying the patient, planned procedure, and the appropriate extremity.     An incision was then made 2 cm lateral to the   anterior superior iliac spine extending over the orientation of the   tensor fascia lata muscle and sharp dissection was carried down to the   fascia of the muscle.      The fascia was then incised.  The muscle belly was identified and swept   laterally and retractor placed along the superior neck.  Following   cauterization of the circumflex vessels and removing some  pericapsular   fat, a second cobra retractor was placed on the inferior neck.  A T-capsulotomy was made along the line of the   superior neck to the trochanteric fossa, then extended proximally and   distally.  Tag sutures were placed and the retractors were then placed   intracapsular.  We then identified the trochanteric fossa and   orientation of my neck cut and then made a neck osteotomy with the femur on traction.  The femoral   head was removed without difficulty or complication.  Traction was let   off and retractors were placed posterior and anterior around the   acetabulum.      The labrum and foveal tissue were debrided.  I began reaming with a 44 mm   reamer and reamed up to 49 mm reamer with good bony bed preparation and a 50 mm  cup was chosen.  The final 50 mm Pinnacle cup was then impacted under fluoroscopy to confirm the depth of penetration and orientation with respect to   Abduction and forward flexion.  A screw was placed into the ilium followed by the hole eliminator.  The final    32+4 neutral Altrex liner was impacted with good visualized rim fit.  The cup was positioned anatomically within the acetabular portion of the pelvis.      At this point, the femur was rolled to 100 degrees.  Further capsule was   released off the inferior aspect of the femoral neck.  I then   released the superior capsule proximally.  With the leg in a neutral position the hook was placed laterally   along the femur under the vastus lateralis origin and elevated manually and then held in position using the hook attachment on the bed.  The leg was then extended and adducted with the leg rolled to 100   degrees of external rotation.  Retractors were placed along the medial calcar and posteriorly over the greater trochanter.  Once the proximal femur was fully   exposed, I used a box osteotome to set orientation.  I then began   broaching with the starting chili pepper broach and passed this by hand and then broached up to 3.  With the 3 broach in place I chose a high offset neck and did several trial reductions.  The offset was appropriate, leg lengths   appeared to be equal best matched with the +5 head ball trial confirmed radiographically.   Given these findings, I went ahead and dislocated the hip, repositioned all   retractors and positioned the right hip in the extended and abducted position.  The final 3 Hi Tri Lock stem was   chosen and it was impacted down to the level of neck cut.  Based on this   and the trial reductions, a final 32+5 delta ceramic ball was chosen and   impacted onto a clean and dry trunnion, and the hip was reduced.  The   hip had been irrigated throughout the case again at this point.  I did   reapproximate the superior capsular leaflet to the anterior leaflet   using #1 Vicryl.  The fascia of the   tensor fascia lata muscle was then reapproximated using #1 Vicryl and #0 Stratafix sutures.  The   remaining wound was closed with 2-0 Vicryl and running 4-0 Monocryl.    The hip was cleaned, dried, and dressed sterilely using Dermabond and   Aquacel dressing.  The patient was then brought  to recovery room in stable condition tolerating the procedure well.    Dennie BibleAshley Stinson, PA-C was present for the entirety of the case involved from   preoperative positioning, perioperative retractor management, general   facilitation of the case, as well as primary wound closure as assistant.            Madlyn FrankelMatthew D. Charlann Boxerlin, M.D.        06/18/2018 3:05 PM

## 2018-06-18 NOTE — Interval H&P Note (Signed)
History and Physical Interval Note:  06/18/2018 1:19 PM  Rebecca Morse  has presented today for surgery, with the diagnosis of Left hip osteoarthritis  The various methods of treatment have been discussed with the patient and family. After consideration of risks, benefits and other options for treatment, the patient has consented to  Procedure(s) with comments: TOTAL HIP ARTHROPLASTY ANTERIOR APPROACH (Left) - 90 mins as a surgical intervention .  The patient's history has been reviewed, patient examined, no change in status, stable for surgery.  I have reviewed the patient's chart and labs.  Questions were answered to the patient's satisfaction.     Shelda Pal

## 2018-06-18 NOTE — Anesthesia Procedure Notes (Signed)
Procedure Name: Intubation Date/Time: 06/18/2018 1:33 PM Performed by: Thornell Mule, CRNA Pre-anesthesia Checklist: Patient identified, Emergency Drugs available, Suction available and Patient being monitored Patient Re-evaluated:Patient Re-evaluated prior to induction Oxygen Delivery Method: Circle system utilized Preoxygenation: Pre-oxygenation with 100% oxygen Induction Type: IV induction Ventilation: Mask ventilation without difficulty Laryngoscope Size: Miller and 3 Grade View: Grade I Tube type: Oral Tube size: 7.5 mm Number of attempts: 1 Airway Equipment and Method: Stylet and Oral airway Placement Confirmation: ETT inserted through vocal cords under direct vision,  positive ETCO2 and breath sounds checked- equal and bilateral Secured at: 20 cm Tube secured with: Tape Dental Injury: Teeth and Oropharynx as per pre-operative assessment

## 2018-06-18 NOTE — Discharge Instructions (Signed)

## 2018-06-18 NOTE — Progress Notes (Signed)
PT Cancellation Note  Patient Details Name: Rebecca ButtsDeborah F Flood MRN: [id number] DOB: Mar 18, 1951   Cancelled Treatment:    Reason Eval/Treat Not Completed: Patient declined, no reason specified;Fatigue/lethargy limiting ability to participate - Pt states she is exhausted, and refuses PT attempt tonight. Pt's eyes half-closed for duration of speaking with PT. Pt states she will mobilize tomorrow. PT to follow up then.   Nicola PoliceAlexa D Ichelle Harral, PT Acute Rehabilitation Services Pager [phone]  Office [phone]   Tyrone AppleAlexa D Despina Hiddenure 06/18/2018, 7:26 PM

## 2018-06-18 NOTE — Transfer of Care (Signed)
Immediate Anesthesia Transfer of Care Note  Patient: Rebecca Morse  Procedure(s) Performed: TOTAL HIP ARTHROPLASTY ANTERIOR APPROACH (Left Hip)  Patient Location: PACU  Anesthesia Type:General  Level of Consciousness: drowsy, patient cooperative and responds to stimulation  Airway & Oxygen Therapy: Patient Spontanous Breathing and Patient connected to face mask oxygen  Post-op Assessment: Report given to RN and Post -op Vital signs reviewed and stable  Post vital signs: Reviewed and stable  Last Vitals:  Vitals Value Taken Time  BP 155/61 06/18/2018  3:57 PM  Temp    Pulse 91 06/18/2018  4:00 PM  Resp 18 06/18/2018  4:00 PM  SpO2 100 % 06/18/2018  4:00 PM  Vitals shown include unvalidated device data.  Last Pain:  Vitals:   06/18/18 1045  TempSrc: Oral         Complications: No apparent anesthesia complications

## 2018-06-19 ENCOUNTER — Encounter (HOSPITAL_COMMUNITY): Payer: Self-pay | Admitting: Orthopedic Surgery

## 2018-06-19 LAB — CBC
HCT: 33 % — ABNORMAL LOW (ref 36.0–46.0)
Hemoglobin: 10.4 g/dL — ABNORMAL LOW (ref 12.0–15.0)
MCH: 27.7 pg (ref 26.0–34.0)
MCHC: 31.5 g/dL (ref 30.0–36.0)
MCV: 87.8 fL (ref 80.0–100.0)
Platelets: 281 10*3/uL (ref 150–400)
RBC: 3.76 MIL/uL — ABNORMAL LOW (ref 3.87–5.11)
RDW: 13.4 % (ref 11.5–15.5)
WBC: 15.8 10*3/uL — ABNORMAL HIGH (ref 4.0–10.5)
nRBC: 0 % (ref 0.0–0.2)

## 2018-06-19 LAB — BASIC METABOLIC PANEL
Anion gap: 8 (ref 5–15)
BUN: 18 mg/dL (ref 8–23)
CO2: 29 mmol/L (ref 22–32)
Calcium: 8.7 mg/dL — ABNORMAL LOW (ref 8.9–10.3)
Chloride: 104 mmol/L (ref 98–111)
Creatinine, Ser: 0.84 mg/dL (ref 0.44–1.00)
GFR calc Af Amer: >60 mL/min (ref >60)
GFR calc non Af Amer: >60 mL/min (ref >60)
GLUCOSE: 166 mg/dL — AB (ref 70–99)
Potassium: 4.7 mmol/L (ref 3.5–5.1)
Sodium: 141 mmol/L (ref 135–145)

## 2018-06-19 NOTE — Progress Notes (Signed)
Patient ID: Rebecca Morse, female   DOB: 07/10/1950, 68 y.o.   MRN: [id number] Subjective: 1 Day Post-Op Procedure(s) (LRB): TOTAL HIP ARTHROPLASTY ANTERIOR APPROACH (Left)    Patient reports pain as mild to moderate depending on activity, comfortable this am  Objective:   VITALS:   Vitals:   06/19/18 0126 06/19/18 0514  BP: 123/64 (!) 126/52  Pulse: 80 87  Resp: 16 17  Temp: (!) 97.5 F (36.4 C) 97.9 F (36.6 C)  SpO2: 97% 99%    Neurovascular intact Incision: dressing C/D/I  LABS Recent Labs    06/19/18 0504  HGB 10.4*  HCT 33.0*  WBC 15.8*  PLT 281    Recent Labs    06/19/18 0504  NA 141  K 4.7  BUN 18  CREATININE 0.84  GLUCOSE 166*    No results for input(s): LABPT, INR in the last 72 hours.   Assessment/Plan: 1 Day Post-Op Procedure(s) (LRB): TOTAL HIP ARTHROPLASTY ANTERIOR APPROACH (Left)   Advance diet Up with therapy  Plan for home discharge today as long as she progresses well with PT Consider NS bolus prior to OOB activity this am (250cc) RTC in 2 weeks

## 2018-06-19 NOTE — Plan of Care (Signed)
Patient dc'd home in stable condition 

## 2018-06-19 NOTE — Progress Notes (Signed)
Physical Therapy Treatment Patient Details Name: Rebecca Morse MRN: [id number] DOB: 1950/06/08 Today's Date: 06/19/2018    History of Present Illness Pt is a 68 year old female s/p L THA with hx of L TSA, R THA (2014), fibromyalgia, and back surgery    PT Comments    Pt ambulated in hallway and practiced safe stair technique.  Pt also performed LE exercises and provided with HEP handout.  Pt had no further questions and feels ready for d/c home today.  Follow Up Recommendations  Follow surgeon's recommendation for DC plan and follow-up therapies     Equipment Recommendations  None recommended by PT    Recommendations for Other Services       Precautions / Restrictions Precautions Precautions: Fall Restrictions LLE Weight Bearing: Weight bearing as tolerated    Mobility  Bed Mobility Overal bed mobility: Needs Assistance Bed Mobility: Sit to Supine     Supine to sit: Min guard Sit to supine: Supervision   General bed mobility comments: verbal cues for technique  Transfers Overall transfer level: Needs assistance Equipment used: 4-wheeled walker Transfers: Sit to/from Stand Sit to Stand: Min guard         General transfer comment: verbal cues for hand placement; pt correctly uses brakes  Ambulation/Gait Ambulation/Gait assistance: Min guard Gait Distance (Feet): 240 Feet Assistive device: 4-wheeled walker Gait Pattern/deviations: Step-through pattern;Decreased stride length     General Gait Details: verbal cues for posture, performing well with rollator   Stairs Stairs: Yes Stairs assistance: Min guard Stair Management: Step to pattern;Forwards;One rail Left Number of Stairs: 3 General stair comments: verbal cues for safety, sequence; performed twice, pt reports understanding   Wheelchair Mobility    Modified Rankin (Stroke Patients Only)       Balance                                            Cognition  Arousal/Alertness: Awake/alert Behavior During Therapy: WFL for tasks assessed/performed Overall Cognitive Status: Within Functional Limits for tasks assessed                                        Exercises Total Joint Exercises Hip ABduction/ADduction: AROM;10 reps;Standing;Left Long Arc Quad: AROM;10 reps;Left;Seated Knee Flexion: AROM;10 reps;Left;Standing Marching in Standing: AROM;10 reps;Standing;Left Standing Hip Extension: AROM;10 reps;Standing;Left    General Comments        Pertinent Vitals/Pain Pain Assessment: 0-10 Pain Score: 2  Pain Location: L hip Pain Descriptors / Indicators: Aching;Sore Pain Intervention(s): Monitored during session;Repositioned;Limited activity within patient's tolerance    Home Living Family/patient expects to be discharged to:: Private residence Living Arrangements: Children Available Help at Discharge: Family Type of Home: House Home Access: Stairs to enter Entrance Stairs-Rails: Right;Left Home Layout: Able to live on main level with bedroom/bathroom Home Equipment: Walker - 2 wheels;Walker - 4 wheels;Cane - single point      Prior Function Level of Independence: Independent with assistive device(s)      Comments: has been using rollator for hip pain prior to surgery   PT Goals (current goals can now be found in the care plan section) Acute Rehab PT Goals PT Goal Formulation: With patient Time For Goal Achievement: 06/23/18 Potential to Achieve Goals: Good Progress towards PT goals: Progressing toward goals  Frequency    7X/week      PT Plan Current plan remains appropriate    Co-evaluation              AM-PAC PT "6 Clicks" Mobility   Outcome Measure  Help needed turning from your back to your side while in a flat bed without using bedrails?: A Little Help needed moving from lying on your back to sitting on the side of a flat bed without using bedrails?: A Little Help needed moving to and  from a bed to a chair (including a wheelchair)?: A Little Help needed standing up from a chair using your arms (e.g., wheelchair or bedside chair)?: A Little Help needed to walk in hospital room?: A Little Help needed climbing 3-5 steps with a railing? : A Little 6 Click Score: 18    End of Session Equipment Utilized During Treatment: Gait belt Activity Tolerance: Patient tolerated treatment well Patient left: with call bell/phone within reach;in bed Nurse Communication: Mobility status PT Visit Diagnosis: Other abnormalities of gait and mobility (R26.89)     Time: 1301-1340 PT Time Calculation (min) (ACUTE ONLY): 39 min  Charges:  $Gait Training: 23-37 mins $Therapeutic Exercise: 8-22 mins                    [name], PT, DPT Acute Rehabilitation Services Office: [phone] Pager: [phone]  [name] 06/19/2018, 2:02 PM

## 2018-06-19 NOTE — Evaluation (Signed)
Physical Therapy Evaluation Patient Details Name: Rebecca ButtsDeborah F Morse MRN: [id number] DOB: 07-22-1950 Today's Date: 06/19/2018   History of Present Illness  Pt is a 68 year old female s/p L THA with hx of L TSA, R THA (2014), fibromyalgia, and back surgery  Clinical Impression  Pt is s/p THA resulting in the deficits listed below (see PT Problem List).  Pt will benefit from skilled PT to increase their independence and safety with mobility to allow discharge to the venue listed below.  Pt ambulated good distance with her rollator POD #1 and plans to d/c home with family assist.  Will return to practice steps and review exercises prior to d/c.     Follow Up Recommendations Follow surgeon's recommendation for DC plan and follow-up therapies    Equipment Recommendations  None recommended by PT    Recommendations for Other Services       Precautions / Restrictions Precautions Precautions: Fall Restrictions LLE Weight Bearing: Weight bearing as tolerated      Mobility  Bed Mobility Overal bed mobility: Needs Assistance Bed Mobility: Supine to Sit     Supine to sit: Min guard     General bed mobility comments: verbal cues for technique  Transfers Overall transfer level: Needs assistance Equipment used: 4-wheeled walker Transfers: Sit to/from Stand Sit to Stand: Min guard         General transfer comment: verbal cues for hand placement; pt correctly uses brakes  Ambulation/Gait Ambulation/Gait assistance: Min guard Gait Distance (Feet): 140 Feet Assistive device: 4-wheeled walker Gait Pattern/deviations: Step-through pattern;Decreased stride length     General Gait Details: verbal cues for posture, utilized pt's rollator as she prefers this and will be using at home  Stairs            Wheelchair Mobility    Modified Rankin (Stroke Patients Only)       Balance                                             Pertinent Vitals/Pain Pain  Assessment: 0-10 Pain Score: 2  Pain Location: L hip Pain Descriptors / Indicators: Aching;Sore Pain Intervention(s): Monitored during session;Limited activity within patient's tolerance;Premedicated before session;Repositioned    Home Living Family/patient expects to be discharged to:: Private residence Living Arrangements: Children Available Help at Discharge: Family Type of Home: House Home Access: Stairs to enter Entrance Stairs-Rails: Doctor, general practiceight;Left Entrance Stairs-Number of Steps: 5 Home Layout: Able to live on main level with bedroom/bathroom Home Equipment: Walker - 2 wheels;Walker - 4 wheels;Cane - single point      Prior Function Level of Independence: Independent with assistive device(s)         Comments: has been using rollator for hip pain prior to surgery     Hand Dominance        Extremity/Trunk Assessment        Lower Extremity Assessment Lower Extremity Assessment: LLE deficits/detail LLE Deficits / Details: anticipated post op hip weakness, able to perform good quad set and ankle pumps       Communication   Communication: No difficulties  Cognition Arousal/Alertness: Awake/alert Behavior During Therapy: WFL for tasks assessed/performed Overall Cognitive Status: Within Functional Limits for tasks assessed  General Comments      Exercises     Assessment/Plan    PT Assessment Patient needs continued PT services  PT Problem List Decreased strength;Decreased mobility;Decreased activity tolerance;Decreased balance;Decreased knowledge of use of DME;Pain       PT Treatment Interventions Stair training;Therapeutic exercise;Therapeutic activities;Gait training;Neuromuscular re-education;Balance training;Functional mobility training;DME instruction;Patient/family education    PT Goals (Current goals can be found in the Care Plan section)  Acute Rehab PT Goals PT Goal Formulation: With  patient Time For Goal Achievement: 06/23/18 Potential to Achieve Goals: Good    Frequency 7X/week   Barriers to discharge        Co-evaluation               AM-PAC PT "6 Clicks" Mobility  Outcome Measure Help needed turning from your back to your side while in a flat bed without using bedrails?: A Little Help needed moving from lying on your back to sitting on the side of a flat bed without using bedrails?: A Little Help needed moving to and from a bed to a chair (including a wheelchair)?: A Little Help needed standing up from a chair using your arms (e.g., wheelchair or bedside chair)?: A Little Help needed to walk in hospital room?: A Little Help needed climbing 3-5 steps with a railing? : A Little 6 Click Score: 18    End of Session Equipment Utilized During Treatment: Gait belt Activity Tolerance: Patient tolerated treatment well Patient left: with call bell/phone within reach;in chair;with chair alarm set Nurse Communication: Mobility status PT Visit Diagnosis: Other abnormalities of gait and mobility (R26.89)    Time: 0942-1000 PT Time Calculation (min) (ACUTE ONLY): 18 min   Charges:   PT Evaluation $PT Eval Low Complexity: 1 Low         [name], PT, DPT Acute Rehabilitation Services Office: [phone] Pager: [phone]  [name] 06/19/2018, 11:43 AM

## 2018-06-20 NOTE — Anesthesia Postprocedure Evaluation (Signed)
Anesthesia Post Note  Patient: Rebecca Morse  Procedure(s) Performed: TOTAL HIP ARTHROPLASTY ANTERIOR APPROACH (Left Hip)     Patient location during evaluation: PACU Anesthesia Type: General Level of consciousness: awake and alert Pain management: pain level controlled Vital Signs Assessment: post-procedure vital signs reviewed and stable Respiratory status: spontaneous breathing, nonlabored ventilation, respiratory function stable and patient connected to nasal cannula oxygen Cardiovascular status: blood pressure returned to baseline and stable Postop Assessment: no apparent nausea or vomiting Anesthetic complications: no    Last Vitals:  Vitals:   06/19/18 1042 06/19/18 1345  BP: (!) 106/41 (!) 110/36  Pulse: 68 67  Resp: 16 16  Temp: 36.8 C 37 C  SpO2: 95% 94%    Last Pain:  Vitals:   06/19/18 1345  TempSrc: Oral  PainSc:                  [name]

## 2018-06-21 NOTE — Discharge Summary (Signed)
Physician Discharge Summary  Patient ID: Rebecca ButtsDeborah F Dugger MRN: [id number] DOB/AGE: 69-27-1952 69 y.o.  Admit date: 06/18/2018 Discharge date: 06/19/2018   Procedures:  Procedure(s) (LRB): TOTAL HIP ARTHROPLASTY ANTERIOR APPROACH (Left)  Attending Physician:  Dr. Durene RomansMatthew Olin   Admission Diagnoses:   Left hip primary OA / pain  Discharge Diagnoses:  Principal Problem:   S/P left THA, AA  Past Medical History:  Diagnosis Date  . Anxiety   . Arthritis   . Complication of anesthesia   . DDD (degenerative disc disease), lumbar   . Depression   . Fibromyalgia   . Headache   . [facility]    following a tick bite  . Hypertension   . Lumbar herniated disc   . PONV (postoperative nausea and vomiting)    Nausea only  . Spinal stenosis, lumbar   . Tuberculosis    positive test    HPI:    Rebecca ButtsDeborah F Luiz, 68 y.o. female, has a history of pain and functional disability in the left hip(s) due to arthritis and patient has failed non-surgical conservative treatments for greater than 12 weeks to include NSAID's and/or analgesics, use of assistive devices and activity modification.  Onset of symptoms was gradual starting 2+ years ago with gradually worsening course since that time.The patient noted prior procedures of the hip to include arthroplasty on the right hip(s).  Patient currently rates pain in the left hip at 10 out of 10 with activity. Patient has night pain, worsening of pain with activity and weight bearing, trendelenberg gait, pain that interfers with activities of daily living and pain with passive range of motion. Patient has evidence of periarticular osteophytes and joint space narrowing by imaging studies. This condition presents safety issues increasing the risk of falls.  There is no current active infection.  Risks, benefits and expectations were discussed with the patient.  Risks including but not limited to the risk of anesthesia, blood  clots, nerve damage, blood vessel damage, failure of the prosthesis, infection and up to and including death.  Patient understand the risks, benefits and expectations and wishes to proceed with surgery.    PCP: Nonnie DoneSlatosky, John J., MD   Discharged Condition: good  Hospital Course:  Patient underwent the above stated procedure on 06/18/2018. Patient tolerated the procedure well and brought to the recovery room in good condition and subsequently to the floor.  POD #1 BP: 126/52 ; Pulse: 87 ; Temp: 97.9 F (36.6 C) ; Resp: 17 Patient reports pain as mild to moderate depending on activity, comfortable this am. Neurovascular intact and incision: dressing C/D/I.   LABS  Basename    HGB     10.4  HCT     33.0    Discharge Exam: General appearance: alert, cooperative and no distress Extremities: Homans sign is negative, no sign of DVT, no edema, redness or tenderness in the calves or thighs and no ulcers, gangrene or trophic changes  Disposition:  Home with follow up in 2 weeks   Follow-up Information    Durene Romanslin, Chizara Mena, MD. Schedule an appointment as soon as possible for a visit in 2 weeks.   Specialty:  Orthopedic Surgery Contact information: [address] [place] [id number] [phone]           Discharge Instructions    Call MD / Call 911   Complete by:  As directed    If you experience chest pain or shortness of breath, CALL 911  and be transported to the hospital emergency room.  If you develope a fever above 101 F, pus (white drainage) or increased drainage or redness at the wound, or calf pain, call your surgeon's office.   Change dressing   Complete by:  As directed    Maintain surgical dressing until follow up in the clinic. If the edges start to pull up, may reinforce with tape. If the dressing is no longer working, may remove and cover with gauze and tape, but must keep the area dry and clean.  Call with any questions or concerns.   Constipation  Prevention   Complete by:  As directed    Drink plenty of fluids.  Prune juice may be helpful.  You may use a stool softener, such as Colace (over the counter) 100 mg twice a day.  Use MiraLax (over the counter) for constipation as needed.   Diet - low sodium heart healthy   Complete by:  As directed    Discharge instructions   Complete by:  As directed    Maintain surgical dressing until follow up in the clinic. If the edges start to pull up, may reinforce with tape. If the dressing is no longer working, may remove and cover with gauze and tape, but must keep the area dry and clean.  [facility] Call with any questions or concerns.   Increase activity slowly as tolerated   Complete by:  As directed    Weight bearing as tolerated with assist device (walker, cane, etc) as directed, use it as long as suggested by your surgeon or therapist, typically at least 4-6 weeks.   TED hose   Complete by:  As directed    Use stockings (TED hose) for 2 weeks on both leg(s).  You may remove them at night for sleeping.      Allergies as of 06/19/2018      Reactions   Ancef [cefazolin] Hives, Rash, Other (See Comments)   Allergic reaction post infusion had increased peak pressure, red rash,, and hives   Cefaclor Hives   Rash also      Medication List    STOP taking these medications   acetaminophen 650 MG CR tablet Commonly known as:  TYLENOL   ibuprofen 200 MG tablet Commonly known as:  ADVIL,MOTRIN     TAKE these medications   ALPRAZolam 0.5 MG tablet Commonly known as:  XANAX Take 0.5 mg by mouth 3 (three) times daily as needed for anxiety.   amLODipine 10 MG tablet Commonly known as:  NORVASC Take 10 mg by mouth daily.   aspirin 81 MG chewable tablet Commonly known as:  ASPIRIN CHILDRENS Chew 1 tablet (81 mg total) by mouth 2 (two) times daily for 30 days. Take for 4 weeks, then resume regular dose.   atenolol 50 MG tablet Commonly known as:   TENORMIN Take 50 mg by mouth daily.   docusate sodium 100 MG capsule Commonly known as:  COLACE Take 1 capsule (100 mg total) by mouth 2 (two) times daily.   DULoxetine 30 MG capsule Commonly known as:  CYMBALTA Take 30 mg by mouth 2 (two) times daily.   ferrous sulfate 325 (65 FE) MG tablet Commonly known as:  FERROUSUL Take 1 tablet (325 mg total) by mouth 3 (three) times daily with meals.   FORTIFY DAILY PROBIOTIC PO Take 1 tablet by mouth daily.   HYDROcodone-acetaminophen 7.5-325 MG tablet Commonly known as:  NORCO Take 1-2  tablets by mouth every 4 (four) hours as needed for moderate pain.   HYDROcodone-acetaminophen 7.5-325 MG tablet Commonly known as:  NORCO Take 1-2 tablets by mouth every 4 (four) hours as needed for moderate pain. Start taking on:  June 23, 2018   lisinopril 20 MG tablet Commonly known as:  PRINIVIL,ZESTRIL Take 20 mg by mouth daily.   methocarbamol 500 MG tablet Commonly known as:  ROBAXIN Take 1 tablet (500 mg total) by mouth every 6 (six) hours as needed for muscle spasms.   multivitamin tablet Take 1 tablet by mouth daily.   polyethylene glycol packet Commonly known as:  MIRALAX / GLYCOLAX Take 17 g by mouth 2 (two) times daily.            Discharge Care Instructions  (From admission, onward)         Start     Ordered   06/19/18 0000  Change dressing    Comments:  Maintain surgical dressing until follow up in the clinic. If the edges start to pull up, may reinforce with tape. If the dressing is no longer working, may remove and cover with gauze and tape, but must keep the area dry and clean.  Call with any questions or concerns.   06/19/18 8270           Signed: Anastasio Auerbach. Swan Zayed   PA-C  06/21/2018, 8:32 AM

## 2020-01-10 ENCOUNTER — Encounter: Payer: Self-pay | Admitting: Gastroenterology

## 2020-03-02 ENCOUNTER — Ambulatory Visit (AMBULATORY_SURGERY_CENTER): Payer: Medicare HMO

## 2020-03-02 ENCOUNTER — Other Ambulatory Visit: Payer: Self-pay

## 2020-03-02 ENCOUNTER — Encounter: Payer: Self-pay | Admitting: Gastroenterology

## 2020-03-02 VITALS — Ht 66.0 in | Wt 201.6 lb

## 2020-03-02 DIAGNOSIS — Z86010 Personal history of colonic polyps: Secondary | ICD-10-CM

## 2020-03-02 MED ORDER — SUTAB 1479-225-188 MG PO TABS: 1.0000 | ORAL_TABLET | ORAL | 0 refills | Status: DC

## 2020-03-02 NOTE — Progress Notes (Signed)
No egg or soy allergy known to patient  No issues with past sedation with any surgeries or procedures No intubation problems in the past  No FH of Malignant Hyperthermia No diet pills per patient No home 02 use per patient  No blood thinners per patient  Pt reports issues with constipation --will advise patient to start Miralax 5 days prior to prep No A fib or A flutter  COVID 19 guidelines implemented in [facility] today with Pt and RN  Coupon given to pt in [facility] today , Code to Pharmacy  COVID screening scheduled on 03/12/2020 at 2:00 pm and patient is aware of appt date/time;  Due to the COVID-19 pandemic we are asking patients to follow these guidelines. Please only bring one care partner. Please be aware that your care partner may wait in the car in the parking lot or if they feel like they will be too hot to wait in the car, they may wait in the lobby on the 4th floor. All care partners are required to wear a mask the entire time (we do not have any that we can provide them), they need to practice social distancing, and we will do a Covid check for all patient's and care partners when you arrive. Also we will check their temperature and your temperature. If the care partner waits in their car they need to stay in the parking lot the entire time and we will call them on their cell phone when the patient is ready for discharge so they can bring the car to the front of the building. Also all patient's will need to wear a mask into building.

## 2020-03-12 ENCOUNTER — Other Ambulatory Visit: Payer: Self-pay | Admitting: Gastroenterology

## 2020-03-12 LAB — SARS CORONAVIRUS 2 (TAT 6-24 HRS): SARS Coronavirus 2: NEGATIVE

## 2020-03-16 ENCOUNTER — Ambulatory Visit (AMBULATORY_SURGERY_CENTER): Payer: Medicare HMO | Admitting: Gastroenterology

## 2020-03-16 ENCOUNTER — Encounter: Payer: Self-pay | Admitting: Gastroenterology

## 2020-03-16 ENCOUNTER — Other Ambulatory Visit: Payer: Self-pay

## 2020-03-16 VITALS — BP 129/58 | HR 69 | Temp 97.3°F | Resp 12 | Ht 66.0 in | Wt 201.6 lb

## 2020-03-16 DIAGNOSIS — Z86010 Personal history of colonic polyps: Secondary | ICD-10-CM | POA: Diagnosis not present

## 2020-03-16 MED ORDER — SODIUM CHLORIDE 0.9 % IV SOLN: 500.0000 mL | Freq: Once | INTRAVENOUS | Status: DC

## 2020-03-16 NOTE — Patient Instructions (Signed)
HANDOUTS PROVIDED ON: DIVERTICULOSIS & HEMORRHOIDS  You may resume your previous diet and medication schedule.  Thank you for allowing us to care for you today!!!   YOU HAD AN ENDOSCOPIC PROCEDURE TODAY AT THE [facility]:   Refer to the procedure report that was given to you for any specific questions about what was found during the examination.  If the procedure report does not answer your questions, please call your gastroenterologist to clarify.  If you requested that your care partner not be given the details of your procedure findings, then the procedure report has been included in a sealed envelope for you to review at your convenience later.  YOU SHOULD EXPECT: Some feelings of bloating in the abdomen. Passage of more gas than usual.  Walking can help get rid of the air that was put into your GI tract during the procedure and reduce the bloating. If you had a lower endoscopy (such as a colonoscopy or flexible sigmoidoscopy) you may notice spotting of blood in your stool or on the toilet paper. If you underwent a bowel prep for your procedure, you may not have a normal bowel movement for a few days.  Please Note:  You might notice some irritation and congestion in your nose or some drainage.  This is from the oxygen used during your procedure.  There is no need for concern and it should clear up in a day or so.  SYMPTOMS TO REPORT IMMEDIATELY:   Following lower endoscopy (colonoscopy or flexible sigmoidoscopy):  Excessive amounts of blood in the stool  Significant tenderness or worsening of abdominal pains  Swelling of the abdomen that is new, acute  Fever of 100F or higher  For urgent or emergent issues, a gastroenterologist can be reached at any hour by calling [phone]. Do not use MyChart messaging for urgent concerns.    DIET:  We do recommend a small meal at first, but then you may proceed to your regular diet.  Drink plenty of fluids but you should avoid  alcoholic beverages for 24 hours.  ACTIVITY:  You should plan to take it easy for the rest of today and you should NOT DRIVE or use heavy machinery until tomorrow (because of the sedation medicines used during the test).    FOLLOW UP: Our staff will call the number listed on your records 48-72 hours following your procedure to check on you and address any questions or concerns that you may have regarding the information given to you following your procedure. If we do not reach you, we will leave a message.  We will attempt to reach you two times.  During this call, we will ask if you have developed any symptoms of COVID 19. If you develop any symptoms (ie: fever, flu-like symptoms, shortness of breath, cough etc.) before then, please call [phone].  If you test positive for Covid 19 in the 2 weeks post procedure, please call and report this information to us.    If any biopsies were taken you will be contacted by phone or by letter within the next 1-3 weeks.  Please call us at [phone] if you have not heard about the biopsies in 3 weeks.    SIGNATURES/CONFIDENTIALITY: You and/or your care partner have signed paperwork which will be entered into your electronic medical record.  These signatures attest to the fact that that the information above on your After Visit Summary has been reviewed and is understood.  Full responsibility of the confidentiality   of this discharge information lies with you and/or your care-partner. 

## 2020-03-16 NOTE — Progress Notes (Signed)
Pt. Reports no change in her medical or surgical history since her pre-visit 03/02/2020.

## 2020-03-16 NOTE — Op Note (Addendum)
[facility] Patient Name: Rebecca Morse Procedure Date: 03/16/2020 10:02 AM MRN: [id number] Endoscopist: Lynann Bologna , MD Age: 69 Referring MD:  Date of Birth: 1950-10-04 Gender: Female Account #: [id number] Procedure:                Colonoscopy Indications:              High risk colon cancer surveillance: Personal                            history of colonic polyps Medicines:                Monitored Anesthesia Care Procedure:                Pre-Anesthesia Assessment:                           - Prior to the procedure, a History and Physical                            was performed, and patient medications and                            allergies were reviewed. The patient's tolerance of                            previous anesthesia was also reviewed. The risks                            and benefits of the procedure and the sedation                            options and risks were discussed with the patient.                            All questions were answered, and informed consent                            was obtained. Prior Anticoagulants: The patient has                            taken no previous anticoagulant or antiplatelet                            agents. ASA Grade Assessment: II - A patient with                            mild systemic disease. After reviewing the risks                            and benefits, the patient was deemed in                            satisfactory condition to undergo the procedure.  After obtaining informed consent, the colonoscope                            was passed under direct vision. Throughout the                            procedure, the patient's blood pressure, pulse, and                            oxygen saturations were monitored continuously. The                            Colonoscope was introduced through the anus and                            advanced to the 2 cm into the ileum.  The                            colonoscopy was performed without difficulty. The                            patient tolerated the procedure well. The quality                            of the bowel preparation was good. The terminal                            ileum, ileocecal valve, appendiceal orifice, and                            rectum were photographed. Scope In: 10:15:27 AM Scope Out: 10:32:41 AM Scope Withdrawal Time: 0 hours 11 minutes 2 seconds  Total Procedure Duration: 0 hours 17 minutes 14 seconds  Findings:                 Multiple medium-mouthed diverticula were found in                            the sigmoid colon and descending colon. Rare                            diverticula in the ascending colon.                           Non-bleeding internal hemorrhoids were found during                            retroflexion. The hemorrhoids were small.                           The terminal ileum appeared normal.                           The exam was otherwise without abnormality on  direct and retroflexion views. Complications:            No immediate complications. Estimated Blood Loss:     Estimated blood loss: none. Impression:               - Moderate predominantly left colonic                            diverticulosis.                           - Non-bleeding internal hemorrhoids.                           - The examined portion of the ileum was normal.                           - The examination was otherwise normal on direct                            and retroflexion views.                           - No specimens collected. Recommendation:           - Patient has a contact number available for                            emergencies. The signs and symptoms of potential                            delayed complications were discussed with the                            patient. Return to normal activities tomorrow.                             Written discharge instructions were provided to the                            patient.                           - Resume previous diet.                           - Continue present medications.                           - Repeat colonoscopy is not recommended due to                            current age (4 years or older) for screening                            purposes. Repeat colonoscopy only if she starts  having any new problems or change in family history.                           - Return to GI clinic PRN. Lynann Bologna, MD 03/16/2020 10:37:31 AM This report has been signed electronically.

## 2020-03-16 NOTE — Progress Notes (Signed)
PT taken to PACU. Monitors in place. VSS. Report given to RN. 

## 2020-03-18 ENCOUNTER — Telehealth: Payer: Self-pay

## 2020-03-18 NOTE — Telephone Encounter (Signed)
Unable to leave message on 2nd follow up call. 

## 2020-03-18 NOTE — Telephone Encounter (Signed)
  Follow up Call-  Call back number 03/16/2020  Post procedure Call Back phone  # [phone]  Permission to leave phone message Yes  Some recent data might be hidden     1st follow up call made.  NAULM, mailbox full.

## 2024-01-18 ENCOUNTER — Ambulatory Visit (INDEPENDENT_AMBULATORY_CARE_PROVIDER_SITE_OTHER)
Admission: RE | Admit: 2024-01-18 | Discharge: 2024-01-18 | Disposition: A | Discharge status: Home / Self Care | Payer: Medicare (Managed Care) | Source: Ambulatory Visit | Attending: Family Medicine | Admitting: Family Medicine

## 2024-01-18 ENCOUNTER — Other Ambulatory Visit (HOSPITAL_BASED_OUTPATIENT_CLINIC_OR_DEPARTMENT_OTHER): Payer: Self-pay | Admitting: Family Medicine

## 2024-01-18 DIAGNOSIS — R072 Precordial pain: Secondary | ICD-10-CM

## 2024-01-18 DIAGNOSIS — R0602 Shortness of breath: Secondary | ICD-10-CM

## 2024-01-18 DIAGNOSIS — R079 Chest pain, unspecified: Secondary | ICD-10-CM

## 2024-02-06 ENCOUNTER — Other Ambulatory Visit: Payer: Self-pay

## 2024-02-06 DIAGNOSIS — Z9889 Other specified postprocedural states: Secondary | ICD-10-CM | POA: Insufficient documentation

## 2024-02-06 DIAGNOSIS — Z8619 Personal history of other infectious and parasitic diseases: Secondary | ICD-10-CM | POA: Insufficient documentation

## 2024-02-06 DIAGNOSIS — I10 Essential (primary) hypertension: Secondary | ICD-10-CM | POA: Insufficient documentation

## 2024-02-06 DIAGNOSIS — T8859XA Other complications of anesthesia, initial encounter: Secondary | ICD-10-CM | POA: Insufficient documentation

## 2024-02-06 DIAGNOSIS — M5126 Other intervertebral disc displacement, lumbar region: Secondary | ICD-10-CM | POA: Insufficient documentation

## 2024-02-06 DIAGNOSIS — A159 Respiratory tuberculosis unspecified: Secondary | ICD-10-CM | POA: Insufficient documentation

## 2024-02-06 DIAGNOSIS — M48061 Spinal stenosis, lumbar region without neurogenic claudication: Secondary | ICD-10-CM | POA: Insufficient documentation

## 2024-02-06 DIAGNOSIS — F419 Anxiety disorder, unspecified: Secondary | ICD-10-CM | POA: Insufficient documentation

## 2024-02-06 DIAGNOSIS — M1990 Unspecified osteoarthritis, unspecified site: Secondary | ICD-10-CM | POA: Insufficient documentation

## 2024-02-06 DIAGNOSIS — M51369 Other intervertebral disc degeneration, lumbar region without mention of lumbar back pain or lower extremity pain: Secondary | ICD-10-CM | POA: Insufficient documentation

## 2024-02-06 DIAGNOSIS — M797 Fibromyalgia: Secondary | ICD-10-CM | POA: Insufficient documentation

## 2024-02-06 DIAGNOSIS — F32A Depression, unspecified: Secondary | ICD-10-CM | POA: Insufficient documentation

## 2024-02-06 DIAGNOSIS — R519 Headache, unspecified: Secondary | ICD-10-CM | POA: Insufficient documentation

## 2024-02-06 NOTE — Progress Notes (Unsigned)
 Cardiology Office Note:    Date:  02/07/2024   ID:  Rebecca Morse, DOB March 18, 1951, MRN [id number]  PCP:  Sabas Norleen PARAS., MD  Cardiologist:  Redell Leiter, MD   Referring MD: Sabas Norleen PARAS., MD  ASSESSMENT:    1. Chest pain of uncertain etiology   2. SOB (shortness of breath) on exertion   3. Essential hypertension   4. Mixed hyperlipidemia   5. Class 2 obesity with body mass index (BMI) of 36.0 to 36.9 in adult, unspecified obesity type, unspecified whether serious comorbidity present    PLAN:    In order of problems listed above:  Clinical problem is persistent chronic exertional shortness of breath and chest pain Differential diagnosis is cardiac CAD versus pulmonary etiology Plan coronary CTA preferably in [facility] also echocardiogram to assess for pulmonary hypertension heart failure Stable hypertension continue her calcium channel blocker Await calcium score May need lipid-lowering treatment  Next appointment 3 months   Medication Adjustments/Labs and Tests Ordered: Current medicines are reviewed at length with the patient today.  Concerns regarding medicines are outlined above.  Orders Placed This Encounter  Procedures   EKG 12-Lead   No orders of the defined types were placed in this encounter.      History of Present Illness:    Rebecca Morse is a 73 y.o. female who is being seen today for the evaluation of chest pain and shortness of breath at the request of Sabas Norleen PARAS., MD.  She was seen with her primary care physician 01/18/2024.  Her complaint was shortness of breath as well as shoulder and jaw pain associated with it with exertion and relieved with rest.  Other problems include hypertension and hyper lipidemia as well as insulin resistance she is on a GLP-1 receptor antagonist.  She was referred for evaluation of chest pain shortness of breath and was given a prescription for nitroglycerin the day of her office visit.  Her office  EKG that day independently reviewed by me shows sinus rhythm RSR prime V1 V2 otherwise normal EKG laboratory studies showed a CBC with a hemoglobin of 13.8 platelets normal 315,000 creatinine 0.83 GFR 74 cc/min potassium 4.2 sodium 105 liver function test were normal TSH normal BNP level was low and the rule out heart failure category are at the day of the office visit 216 pounds with a  BMI 36.  She noted the onset in April of shortness of breath when she sustained trauma and a fall and had COVID infection with prominent respiratory symptoms traveling to [place] since then she is breathless when she climbs stairs does outdoor activities she has had exertional tightness in her chest at times that seems to have improved but she finds herself to be very fatigued at times she wheezes she uses a bronchodilator does not have a chronic cough but has about a 35-year pack year smoking history.  She is on lipid-lowering therapy with fenofibrate and has hypertension taking a calcium channel blocker.  Her chest pain is not pleuritic she has no history of DVT no associated GI symptoms. Past Medical History:  Diagnosis Date   Anxiety    on meds   Arthritis    Complication of anesthesia    DDD (degenerative disc disease), lumbar    Depression    on meds   Expected blood loss anemia 04/11/2013   Fibromyalgia    Headache    History of [facility] spotted fever    following a tick bite  Hypertension    on meds   Left upper quadrant pain 11/07/2017   Lipoma of abdominal wall 11/07/2017   Lumbar herniated disc    Obese 04/11/2013   Pain of left hip joint 05/23/2018   PONV (postoperative nausea and vomiting)    Nausea only   Postoperative examination 12/26/2017   S/P left THA, 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   S/P right THA, AA 04/09/2013   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   Spinal stenosis, lumbar    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    Past Surgical History:  Procedure Laterality Date   ABDOMINAL  HYSTERECTOMY     BACK SURGERY     CHOLECYSTECTOMY  1974   COLONOSCOPY  03/2013   RG-TA x 7 frags   POLYPECTOMY  2014   RG-TA x 7 frags   TONSILLECTOMY     TOTAL HIP ARTHROPLASTY Right 04/09/2013   Procedure: RIGHT TOTAL HIP ARTHROPLASTY ANTERIOR APPROACH;  Surgeon: Donnice JONETTA Car, MD;  Location: [facility] ORS;  Service: Orthopedics;  Laterality: Right;   TOTAL HIP ARTHROPLASTY Left 06/18/2018   Procedure: TOTAL HIP ARTHROPLASTY ANTERIOR APPROACH;  Surgeon: Car Donnice, MD;  Location: [facility];  Service: Orthopedics;  Laterality: Left;  90 mins   TOTAL SHOULDER ARTHROPLASTY Left 07/31/2015   Procedure: TOTAL SHOULDER ARTHROPLASTY;  Surgeon: Marcey Her, MD;  Location: [facility] OR;  Service: Orthopedics;  Laterality: Left;   TUBAL LIGATION     WISDOM TOOTH EXTRACTION      Current Medications: Current Meds  Medication Sig   albuterol (VENTOLIN HFA) 108 (90 Base) MCG/ACT inhaler Inhale 2 puffs into the lungs every 4 (four) hours as needed.   ALPRAZolam  (XANAX ) 0.5 MG tablet Take 0.5 mg by mouth 3 (three) times daily as needed for anxiety.    amLODipine  (NORVASC ) 10 MG tablet Take 10 mg by mouth daily.   atenolol  (TENORMIN ) 50 MG tablet Take 50 mg by mouth daily.   fenofibrate 160 MG tablet Take 160 mg by mouth daily.   FLUoxetine (PROZAC) 20 MG capsule Take 20 mg by mouth daily.   lisinopril  (PRINIVIL ,ZESTRIL ) 20 MG tablet Take 20 mg by mouth daily.   MAGNESIUM  CITRATE PO Take 250 mg by mouth daily.   Multiple Vitamin (MULTIVITAMIN) tablet Take 1 tablet by mouth daily.   nitroGLYCERIN (NITROSTAT) 0.4 MG SL tablet Place 0.4 mg under the tongue every 5 (five) minutes as needed for chest pain.   Probiotic Product (FORTIFY DAILY PROBIOTIC PO) Take 1 tablet by mouth daily.     Allergies:   Cefazolin , Cefaclor, Other, and Statins   Social History   Socioeconomic History   Marital status: Divorced    Spouse name: Not on file   Number of children: Not on file   Years of education: Not on file    Highest education level: Not on file  Occupational History   Not on file  Tobacco Use   Smoking status: Former    Current packs/day: 0.00    Average packs/day: 1 pack/day for 34.0 years (34.0 ttl pk-yrs)    Types: Cigarettes    Start date: 06/06/1964    Quit date: 06/06/1998    Years since quitting: 25.6   Smokeless tobacco: Never  Vaping Use   Vaping status: Never Used  Substance and Sexual Activity   Alcohol use: Yes    Comment: occassionally   Drug use: No   Sexual activity: Not on file  Other Topics Concern   Not on file  Social History Narrative  Not on file   Social Drivers of Health   Financial Resource Strain: Not on file  Food Insecurity: Not on file  Transportation Needs: Not on file  Physical Activity: Not on file  Stress: Not on file  Social Connections: Not on file     Family History: The patient's family history is negative for Colon polyps, Colon cancer, Esophageal cancer, Rectal cancer, and Stomach cancer.  ROS:   ROS Please see the history of present illness.     All other systems reviewed and are negative.  EKGs/Labs/Other Studies Reviewed:    EKG Interpretation Date/Time:  Wednesday February 07 2024 14:50:35 EDT Ventricular Rate:  67 PR Interval:  160 QRS Duration:  92 QT Interval:  432 QTC Calculation: 456 R Axis:   55  Text Interpretation: Normal sinus rhythm Incomplete right bundle branch block When compared with ECG of 11-Jun-2018 15:26, No significant change was found Confirmed by Monetta Rogue ([id number]) on 02/07/2024 2:52:09 PM    Physical Exam:    VS:  BP 138/74   Pulse 67   Ht 5' 6 (1.676 m)   Wt 217 lb 6.4 oz (98.6 kg)   SpO2 95%   BMI 35.09 kg/m     Wt Readings from Last 3 Encounters:  02/07/24 217 lb 6.4 oz (98.6 kg)  03/16/20 201 lb 9.6 oz (91.4 kg)  03/02/20 201 lb 9.6 oz (91.4 kg)     GEN: Obese BMI of 35 well nourished, well developed in no acute distress HEENT: Normal NECK: No JVD; No carotid bruits LYMPHATICS: No  lymphadenopathy CARDIAC: Hyperinflated distant heart sounds RRR, no murmurs, rubs, gallops RESPIRATORY:  Clear to auscultation without rales, wheezing or rhonchi  ABDOMEN: Soft, non-tender, non-distended MUSCULOSKELETAL:  No edema; No deformity  SKIN: Warm and dry NEUROLOGIC:  Alert and oriented x 3 PSYCHIATRIC:  Normal affect     Signed, Rogue Monetta, MD  02/07/2024 3:07 PM    [facility]

## 2024-02-07 ENCOUNTER — Encounter: Payer: Self-pay | Admitting: Cardiology

## 2024-02-07 ENCOUNTER — Ambulatory Visit: Payer: Medicare (Managed Care) | Attending: Cardiology | Admitting: Cardiology

## 2024-02-07 VITALS — BP 138/74 | HR 67 | Ht 66.0 in | Wt 217.4 lb

## 2024-02-07 DIAGNOSIS — E66812 Obesity, class 2: Secondary | ICD-10-CM

## 2024-02-07 DIAGNOSIS — Z6836 Body mass index (BMI) 36.0-36.9, adult: Secondary | ICD-10-CM

## 2024-02-07 DIAGNOSIS — R079 Chest pain, unspecified: Secondary | ICD-10-CM

## 2024-02-07 DIAGNOSIS — E782 Mixed hyperlipidemia: Secondary | ICD-10-CM

## 2024-02-07 DIAGNOSIS — R0602 Shortness of breath: Secondary | ICD-10-CM

## 2024-02-07 DIAGNOSIS — R072 Precordial pain: Secondary | ICD-10-CM

## 2024-02-07 DIAGNOSIS — I10 Essential (primary) hypertension: Secondary | ICD-10-CM | POA: Diagnosis not present

## 2024-02-07 MED ORDER — METOPROLOL TARTRATE 100 MG PO TABS: 100.0000 mg | ORAL_TABLET | Freq: Once | ORAL | 0 refills | Status: DC

## 2024-02-07 NOTE — Patient Instructions (Addendum)
 Medication Instructions:  Your physician recommends that you continue on your current medications as directed. Please refer to the Current Medication list given to you today.  *If you need a refill on your cardiac medications before your next appointment, please call your pharmacy*  Lab Work: Your physician recommends that you return for lab work in:   Labs today: BMP  If you have labs (blood work) drawn today and your tests are completely normal, you will receive your results only by: MyChart Message (if you have MyChart) OR A paper copy in the mail If you have any lab test that is abnormal or we need to change your treatment, we will call you to review the results.  Testing/Procedures: Your physician has requested that you have an echocardiogram. Echocardiography is a painless test that uses sound waves to create images of your heart. It provides your doctor with information about the size and shape of your heart and how well your heart's chambers and valves are working. This procedure takes approximately one hour. There are no restrictions for this procedure. Please do NOT wear cologne, perfume, aftershave, or lotions (deodorant is allowed). Please arrive 15 minutes prior to your appointment time.  Please note: We ask at that you not bring children with you during ultrasound (echo/ vascular) testing. Due to room size and safety concerns, children are not allowed in the ultrasound rooms during exams. Our front office staff cannot provide observation of children in our lobby area while testing is being conducted. An adult accompanying a patient to their appointment will only be allowed in the ultrasound room at the discretion of the ultrasound technician under special circumstances. We apologize for any inconvenience.      Your cardiac CT will be scheduled at one of the below locations:   [facility] [address] [place], [place] [address] [phone] (Severe contrast  allergies only)  OR   [facility] [address] [place], [place] [address] [phone]  OR   [facility] [address] [place], [place] [address] [phone]  OR   [facility] and Vascular Tower [address]  [place], [place] [address] [phone]  OR   [facility] [address] [place], [place] [phone]  If scheduled at [facility], please arrive at the [facility] and Children's Entrance (Entrance C2) of [facility] 30 minutes prior to test start time. You can use the FREE valet parking offered at entrance C (encouraged to control the heart rate for the test)  Proceed to the [facility] Radiology Department (first floor) to check-in and test prep.  All radiology patients and guests should use entrance C2 at [facility], accessed from [facility], even though the hospital's physical address listed is [address]  If scheduled at the Heart and Vascular Tower at [address], please enter the parking lot using the [address] entrance and use the FREE valet service at the patient drop-off area. Enter the building and check-in with registration on the main floor.  If scheduled at [facility], please arrive to the Heart and Vascular Center 15 mins early for check-in and test prep.  There is spacious parking and easy access to the radiology department from the [facility] Heart and Vascular entrance. Please enter here and check-in with the desk attendant.   If scheduled at [facility], please arrive 30 minutes early for check-in and test prep.  Please follow these instructions carefully (unless otherwise directed):  On the Night Before the Test: Be sure to Drink plenty of water . Do not consume any caffeinated/decaffeinated beverages or chocolate 12 hours prior to your test. Do not take any antihistamines 12 hours prior to your  test.  On the Day of the Test: Drink plenty of water  until 1 hour prior to the test. Do not eat any food 1 hour prior to test. You may take your regular medications prior to the test.  Take metoprolol  (Lopressor ) two hours prior to test. Patients who wear a continuous glucose monitor MUST remove the device prior to scanning. FEMALES- please wear underwire-free bra if available, avoid dresses & tight clothing      After the Test: Drink plenty of water . After receiving IV contrast, you may experience a mild flushed feeling. This is normal. On occasion, you may experience a mild rash up to 24 hours after the test. This is not dangerous. If this occurs, you can take Benadryl  25 mg, Zyrtec, Claritin, or Allegra and increase your fluid intake. (Patients taking Tikosyn should avoid Benadryl , and may take Zyrtec, Claritin, or Allegra) If you experience trouble breathing, this can be serious. If it is severe call 911 IMMEDIATELY. If it is mild, please call our office.  We will call to schedule your test 2-4 weeks out understanding that some insurance companies will need an authorization prior to the service being performed.   For more information and frequently asked questions, please visit our website : [email or web address]  For non-scheduling related questions, please contact the cardiac imaging nurse navigator should you have any questions/concerns: Cardiac Imaging Nurse Navigators Direct Office Dial: [phone]   For scheduling needs, including cancellations and rescheduling, please call [place], [phone].   Follow-Up: At [facility], you and your health needs are our priority.  As part of our continuing mission to provide you with exceptional heart care, our providers are all part of one team.  This team includes your primary Cardiologist (physician) and Advanced Practice Providers or APPs (Physician Assistants and Nurse Practitioners) who all work together to  provide you with the care you need, when you need it.  Your next appointment:   3 month(s)  Provider:   Redell Leiter, MD    We recommend signing up for the patient portal called MyChart.  Sign up information is provided on this After Visit Summary.  MyChart is used to connect with patients for Virtual Visits (Telemedicine).  Patients are able to view lab/test results, encounter notes, upcoming appointments, etc.  Non-urgent messages can be sent to your provider as well.   To learn more about what you can do with MyChart, go to [email or web address].   Other Instructions None

## 2024-02-08 LAB — BASIC METABOLIC PANEL WITH GFR
BUN/Creatinine Ratio: 25 (ref 12–28)
BUN: 25 mg/dL (ref 8–27)
CO2: 25 mmol/L (ref 20–29)
Calcium: 10 mg/dL (ref 8.7–10.3)
Chloride: 102 mmol/L (ref 96–106)
Creatinine, Ser: 1 mg/dL (ref 0.57–1.00)
Glucose: 100 mg/dL — ABNORMAL HIGH (ref 70–99)
Potassium: 4.2 mmol/L (ref 3.5–5.2)
Sodium: 143 mmol/L (ref 134–144)
eGFR: 59 mL/min/{1.73_m2} — ABNORMAL LOW

## 2024-02-09 ENCOUNTER — Ambulatory Visit: Payer: Self-pay

## 2024-02-09 ENCOUNTER — Ambulatory Visit: Payer: Medicare (Managed Care)

## 2024-02-21 ENCOUNTER — Encounter: Payer: Self-pay | Admitting: Neurology

## 2024-02-27 ENCOUNTER — Telehealth: Payer: Self-pay | Admitting: Cardiology

## 2024-02-27 NOTE — Telephone Encounter (Signed)
 Pt c/o medication issue:  1. Name of Medication: metoprolol  tartrate (LOPRESSOR ) 100 MG tablet   2. How are you currently taking this medication (dosage and times per day)? As written  3. Are you having a reaction (difficulty breathing--STAT)? no  4. What is your medication issue? Pt thought her Echo was today and she thought she was suppose to take it before CT and she has taken it today  Please send in new script.

## 2024-02-29 NOTE — Telephone Encounter (Signed)
Attempted to call the patient. Patient did not answer the phone and there was no voicemail to leave a message.

## 2024-03-01 ENCOUNTER — Other Ambulatory Visit: Payer: Self-pay

## 2024-03-01 MED ORDER — METOPROLOL TARTRATE 100 MG PO TABS: 100.0000 mg | ORAL_TABLET | Freq: Once | ORAL | 0 refills | Status: DC

## 2024-03-01 NOTE — Telephone Encounter (Signed)
 Patient's Metoprolol  was re-filled via Epic and sent to her pharmacy.

## 2024-03-01 NOTE — Telephone Encounter (Signed)
Attempted to call the patient. Patient did not answer the phone and there was no voicemail to leave a message.

## 2024-03-05 ENCOUNTER — Ambulatory Visit: Payer: Medicare (Managed Care) | Attending: Cardiology

## 2024-03-05 DIAGNOSIS — Z6836 Body mass index (BMI) 36.0-36.9, adult: Secondary | ICD-10-CM

## 2024-03-05 DIAGNOSIS — I10 Essential (primary) hypertension: Secondary | ICD-10-CM | POA: Diagnosis not present

## 2024-03-05 DIAGNOSIS — R079 Chest pain, unspecified: Secondary | ICD-10-CM | POA: Diagnosis not present

## 2024-03-05 DIAGNOSIS — R072 Precordial pain: Secondary | ICD-10-CM

## 2024-03-05 DIAGNOSIS — E782 Mixed hyperlipidemia: Secondary | ICD-10-CM | POA: Diagnosis not present

## 2024-03-05 DIAGNOSIS — R0602 Shortness of breath: Secondary | ICD-10-CM

## 2024-03-05 DIAGNOSIS — E66812 Obesity, class 2: Secondary | ICD-10-CM

## 2024-03-05 LAB — ECHOCARDIOGRAM COMPLETE
Area-P 1/2: 2.87 cm2
MV M vel: 4.48 m/s
MV Peak grad: 80.3 mmHg
S' Lateral: 3.3 cm

## 2024-03-06 ENCOUNTER — Encounter: Payer: Self-pay | Admitting: Cardiology

## 2024-03-13 ENCOUNTER — Telehealth (HOSPITAL_COMMUNITY): Payer: Self-pay | Admitting: Emergency Medicine

## 2024-03-13 NOTE — Telephone Encounter (Signed)
 Attempted to call patient regarding upcoming cardiac CT appointment. Left message on voicemail with name and callback number Rockwell Alexandria RN Navigator Cardiac Imaging [facility] [phone] Office [phone] Cell

## 2024-03-14 ENCOUNTER — Inpatient Hospital Stay (HOSPITAL_BASED_OUTPATIENT_CLINIC_OR_DEPARTMENT_OTHER)
Admission: RE | Admit: 2024-03-14 | Discharge: 2024-03-14 | Payer: Medicare (Managed Care) | Attending: Cardiology | Admitting: Cardiology

## 2024-03-14 DIAGNOSIS — R072 Precordial pain: Secondary | ICD-10-CM | POA: Diagnosis not present

## 2024-03-14 DIAGNOSIS — I2510 Atherosclerotic heart disease of native coronary artery without angina pectoris: Secondary | ICD-10-CM | POA: Diagnosis not present

## 2024-03-14 DIAGNOSIS — I700 Atherosclerosis of aorta: Secondary | ICD-10-CM | POA: Diagnosis not present

## 2024-03-14 MED ORDER — NITROGLYCERIN 0.4 MG SL SUBL
0.8000 mg | SUBLINGUAL_TABLET | Freq: Once | SUBLINGUAL | Status: AC
  Administered 2024-03-14: 0.8 mg via SUBLINGUAL

## 2024-03-14 MED ORDER — IOHEXOL 350 MG/ML SOLN
100.0000 mL | Freq: Once | INTRAVENOUS | Status: AC | PRN
  Administered 2024-03-14: 100 mL via INTRAVENOUS

## 2024-03-16 ENCOUNTER — Ambulatory Visit (HOSPITAL_BASED_OUTPATIENT_CLINIC_OR_DEPARTMENT_OTHER)
Admission: RE | Admit: 2024-03-16 | Discharge: 2024-03-16 | Disposition: A | Discharge status: Home / Self Care | Payer: Medicare (Managed Care) | Source: Ambulatory Visit | Attending: Cardiology | Admitting: Cardiology

## 2024-03-16 ENCOUNTER — Other Ambulatory Visit: Payer: Self-pay | Admitting: Cardiology

## 2024-03-16 DIAGNOSIS — R931 Abnormal findings on diagnostic imaging of heart and coronary circulation: Secondary | ICD-10-CM | POA: Diagnosis not present

## 2024-03-16 NOTE — Progress Notes (Signed)
Ffr Order

## 2024-03-20 ENCOUNTER — Ambulatory Visit: Payer: Self-pay | Admitting: Cardiology

## 2024-03-25 NOTE — Progress Notes (Deleted)
 Initial neurology clinic note  Rebecca Morse MRN: [id number] DOB: Oct 13, 1950  Referring provider: Sabas Norleen PARAS., MD  Primary care provider: Sabas Norleen PARAS., MD  Reason for consult:  headache, tremors  Subjective:  This is [name], a 73 y.o. ***-handed female with a medical history of HTN, HLD, pre-DM, fibromyalgia, OA, anxiety, depression*** who presents to neurology clinic with headaches and tremors. The patient is accompanied by ***.  *** Saw PCP on 01/18/24 (Dr. Sabas) and mentioned generalized headaches for 6 months Daily headaches Last 1/2 to full day Photophobia Intermittent hazy vision  Tremor in hands for last year  Burning sensation in feet Right carpal tunnel syndrome?  On fluoxetine 20 mg daily Wegovy or Mounjaro - DM or weight loss?  Allergy to statins and Cymbalta   Smoker: OCP use: Caffiene use: EtOH use: Restrictive diet: Family history of neurologic disease including headaches:   MEDICATIONS:  Outpatient Encounter Medications as of 04/03/2024  Medication Sig   albuterol (VENTOLIN HFA) 108 (90 Base) MCG/ACT inhaler Inhale 2 puffs into the lungs every 4 (four) hours as needed.   ALPRAZolam  (XANAX ) 0.5 MG tablet Take 0.5 mg by mouth 3 (three) times daily as needed for anxiety.    amLODipine  (NORVASC ) 10 MG tablet Take 10 mg by mouth daily.   atenolol  (TENORMIN ) 50 MG tablet Take 50 mg by mouth daily.   fenofibrate 160 MG tablet Take 160 mg by mouth daily.   FLUoxetine (PROZAC) 20 MG capsule Take 20 mg by mouth daily.   lisinopril  (PRINIVIL ,ZESTRIL ) 20 MG tablet Take 20 mg by mouth daily.   MAGNESIUM  CITRATE PO Take 250 mg by mouth daily.   metoprolol  tartrate (LOPRESSOR ) 100 MG tablet Take 1 tablet (100 mg total) by mouth once for 1 dose. Please take this medication 2 hours before CT   Multiple Vitamin (MULTIVITAMIN) tablet Take 1 tablet by mouth daily.   nitroGLYCERIN (NITROSTAT) 0.4 MG SL tablet Place 0.4 mg under the  tongue every 5 (five) minutes as needed for chest pain.   Probiotic Product (FORTIFY DAILY PROBIOTIC PO) Take 1 tablet by mouth daily.   No facility-administered encounter medications on file as of 04/03/2024.    PAST MEDICAL HISTORY: Past Medical History:  Diagnosis Date   Anxiety    on meds   Arthritis    Complication of anesthesia    DDD (degenerative disc disease), lumbar    Depression    on meds   Expected blood loss anemia 04/11/2013   Fibromyalgia    Headache    History of [facility] spotted fever    following a tick bite   Hypertension    on meds   Left upper quadrant pain 11/07/2017   Lipoma of abdominal wall 11/07/2017   Lumbar herniated disc    Obese 04/11/2013   Pain of left hip joint 05/23/2018   PONV (postoperative nausea and vomiting)    Nausea only   Postoperative examination 12/26/2017   S/P left THA, 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   S/P right THA, AA 04/09/2013   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   Spinal stenosis, lumbar    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    PAST SURGICAL HISTORY: Past Surgical History:  Procedure Laterality Date   ABDOMINAL HYSTERECTOMY     BACK SURGERY     CHOLECYSTECTOMY  1974   COLONOSCOPY  03/2013   RG-TA x 7 frags   POLYPECTOMY  2014   RG-TA x 7 frags   TONSILLECTOMY  TOTAL HIP ARTHROPLASTY Right 04/09/2013   Procedure: RIGHT TOTAL HIP ARTHROPLASTY ANTERIOR APPROACH;  Surgeon: Donnice JONETTA Car, MD;  Location: [facility];  Service: Orthopedics;  Laterality: Right;   TOTAL HIP ARTHROPLASTY Left 06/18/2018   Procedure: TOTAL HIP ARTHROPLASTY ANTERIOR APPROACH;  Surgeon: Car Donnice, MD;  Location: [facility];  Service: Orthopedics;  Laterality: Left;  90 mins   TOTAL SHOULDER ARTHROPLASTY Left 07/31/2015   Procedure: TOTAL SHOULDER ARTHROPLASTY;  Surgeon: Marcey Her, MD;  Location: [facility];  Service: Orthopedics;  Laterality: Left;   TUBAL LIGATION     WISDOM TOOTH EXTRACTION      ALLERGIES: Allergies  Allergen Reactions    Cefazolin  Hives, Other (See Comments), Rash and Dermatitis    Allergic reaction post infusion had increased peak pressure, red rash,, and hives  Allergic reaction post infusion had increased peak pressure, red rash,, and hives  Allergic reaction post infusion had increased peak pressure, red rash,, and hives   Cefaclor Hives    Rash also   Other Other (See Comments)    Unknown   Statins     Other Reaction(s): Unknown    FAMILY HISTORY: Family History  Problem Relation Age of Onset   Colon polyps Neg Hx    Colon cancer Neg Hx    Esophageal cancer Neg Hx    Rectal cancer Neg Hx    Stomach cancer Neg Hx     SOCIAL HISTORY: Social History   Tobacco Use   Smoking status: Former    Current packs/day: 0.00    Average packs/day: 1 pack/day for 34.0 years (34.0 ttl pk-yrs)    Types: Cigarettes    Start date: 06/06/1964    Quit date: 06/06/1998    Years since quitting: 25.8   Smokeless tobacco: Never  Vaping Use   Vaping status: Never Used  Substance Use Topics   Alcohol use: Yes    Comment: occassionally   Drug use: No   Social History   Social History Narrative   Not on file    Objective:  Vital Signs:  There were no vitals taken for this visit.  ***  Labs and Imaging review: Internal labs: ***  External labs: 01/18/24 TSH wnl CBC w/ diff unremarkable CMP unremarkable Lipid panel: tChol 155, LDL 91, TG 156 HbA1c: 5.9 A87: 566 Folate > 20.0  Imaging/Procedures: CT head and cervical spine wo contrast (external 06/20/21): IMPRESSION: There are no findings to suggest an acute fracture or subluxation within the cervical spine.   No acute intracranial hemorrhage is seen.   There is mild cerebral atrophy and periventricular white matter changes are seen.   Lumbar spine xray (09/08/2009): Findings: There are five lumbar-type vertebral bodies.  Diffuse  degenerative disc disease and facet disease throughout the lumbar  spine.  Normal alignment.  No fracture.   SI joints are  unremarkable.    IMPRESSION:  Diffuse degenerative disc disease and facet disease.  No acute  findings.  ***  Assessment/Plan:  Rebecca Morse is a 73 y.o. female who presents for evaluation of ***. *** has a relevant medical history of ***. *** neurological examination is pertinent for ***. Available diagnostic data is significant for ***. This constellation of symptoms and objective data would most likely localize to ***. ***  PLAN: -Blood work: *** ***  -Return to clinic ***  The impression above as well as the plan as outlined below were extensively discussed with the patient (in the company of ***) who voiced understanding. All questions  were answered to their satisfaction.  The patient was counseled on pertinent fall precautions per the printed material provided today, and as noted under the Patient Instructions section below.***  When available, results of the above investigations and possible further recommendations will be communicated to the patient via telephone/MyChart. Patient to call office if not contacted after expected testing turnaround time.   Total time spent reviewing records, interview, history/exam, documentation, and coordination of care on day of encounter:  *** min   Thank you for allowing me to participate in patient's care.  If I can answer any additional questions, I would be pleased to do so.  Venetia Potters, MD   CC: Sabas Norleen PARAS., MD [phone] [address] [place] [address]  CC: Referring provider: Sabas Norleen PARAS., MD [address] [place],  [place] [address]

## 2024-03-29 ENCOUNTER — Telehealth: Payer: Self-pay | Admitting: Cardiology

## 2024-03-29 NOTE — Telephone Encounter (Signed)
 Patient says she is returning a call from Richard from yesterday afternoon.

## 2024-04-03 ENCOUNTER — Ambulatory Visit: Payer: Medicare (Managed Care) | Admitting: Neurology

## 2024-04-09 ENCOUNTER — Encounter: Payer: Self-pay | Admitting: Cardiology

## 2024-05-14 NOTE — Telephone Encounter (Signed)
 Attempted to call the patient on her home phone. Patient did not answer the phone. There was no voice mail to leave a message for her to call back.

## 2024-05-14 NOTE — Telephone Encounter (Signed)
 Left message for the patient to call back on her cell phone.

## 2024-05-14 NOTE — Telephone Encounter (Signed)
 Pt returning call to a nurse

## 2024-05-20 ENCOUNTER — Other Ambulatory Visit: Payer: Self-pay

## 2024-05-20 NOTE — Progress Notes (Unsigned)
 Cardiology Office Note:    Date:  05/21/2024   ID:  Rebecca Morse, DOB 19-Aug-1950, MRN [id number]  PCP:  Sabas Norleen PARAS., MD  Cardiologist:  Rebecca Leiter, MD    Referring MD: Sabas Norleen PARAS., MD    ASSESSMENT:    1. Coronary artery disease of native artery of native heart with stable angina pectoris   2. Agatston coronary artery calcium score between 200 and 399   3. ASD secundum   4. Essential hypertension   5. Mixed hyperlipidemia    PLAN:    In order of problems listed above:  CTA shows CAD nonflow-limiting stenoses does not require revascularization she is taking aspirin  continue 81 mg daily discontinue beta-blocker with her malaise and depression switch to rate limiting calcium channel blocker and institute bempedoic acid  if she is statin intolerant. Stable hypertension BP at target changes as above Goal LDL target less than 70 ideally in the range of 50 in Apo B 50 or less   Next appointment: 1 year   Medication Adjustments/Labs and Tests Ordered: Current medicines are reviewed at length with the patient today.  Concerns regarding medicines are outlined above.  No orders of the defined types were placed in this encounter.  No orders of the defined types were placed in this encounter.    History of Present Illness:    Rebecca Morse is a 73 y.o. female with a hx of hypertension hyperlipidemia chest pain and shortness of breath last seen 02/07/2024.  Following the visit she had an echocardiogram reported 03/06/1999 left ventricle normal size wall thickness systolic function diastolic function and GLS.  She had mild mitral regurgitation with mitral annular calcification and mild aortic valve sclerosis and calcification without stenosis.  Her cardiac CTA showed an elevated calcium score of 279 79th percentile and moderate stenosis of several vessels right coronary mid LAD and first diagonal branch presence of a secundum ASD left-to-right flow.  FFR did not  show findings of hemodynamically significant stenosis.  In reference to ASD her right ventricle was normal in size and both the left and right atrium and there was no finding of pulmonary artery hypertension hypertension.  Compliance with diet, lifestyle and medications: Yes  We reviewed the results of her CTA there is no indication for correction and would not be done in her age group for the ASD I think it is an incidental finding. Reviewed the results of her coronary calcium score and the need for effective lipid-lowering therapy discontinue fenofibrate she is hesitant to take a statin she was intolerant in the remote past and will use bempedoic acid  with follow-up lab 2 months She is concerned about depression being de energized and could be contributed to with her beta-blocker will take advantage of pharmacology discontinue amlodipine  and atenolol  and substitute rate limiting calcium channel blocker She  does not have frequent or bothersome angina and I told her if she is having episodes that were disruptive we could add an oral nitrate Past Medical History:  Diagnosis Date   Anxiety    on meds   Arthritis    Complication of anesthesia    DDD (degenerative disc disease), lumbar    Depression    on meds   Expected blood loss anemia 04/11/2013   Fibromyalgia    Headache    History of [facility] spotted fever    following a tick bite   Hypertension    on meds   Left upper quadrant pain 11/07/2017  Lipoma of abdominal wall 11/07/2017   Lumbar herniated disc    Obese 04/11/2013   Pain of left hip joint 05/23/2018   PONV (postoperative nausea and vomiting)    Nausea only   Postoperative examination 12/26/2017   S/P left THA, 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   S/P right THA, AA 04/09/2013   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   Spinal stenosis, lumbar    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    Current Medications: Active Medications[1]    EKGs/Labs/Other Studies Reviewed:     The following studies were reviewed today:  Cardiac Studies & Procedures   ______________________________________________________________________________________________     ECHOCARDIOGRAM  ECHOCARDIOGRAM COMPLETE 03/05/2024  Narrative ECHOCARDIOGRAM REPORT    Patient Name:   Rebecca Morse Date of Exam: 03/05/2024 Medical Rec #:  [id number]         Height:       66.0 in Accession #:    [id number]        Weight:       217.4 lb Date of Birth:  Jul 09, 1950         BSA:          2.072 m Patient Age:    73 years          BP:           138/74 mmHg Patient Gender: F                 HR:           62 bpm. Exam Location:  [facility]  Procedure: 2D Echo, Cardiac Doppler, Color Doppler and Strain Analysis (Both Spectral and Color Flow Doppler were utilized during procedure).  Indications:    Chest Pain R07.9  History:        Patient has no prior history of Echocardiogram examinations. Signs/Symptoms:Shortness of Breath and Chest Pain; Risk Factors:Hypertension and Dyslipidemia.  Sonographer:    Rebecca Morse RDCS Referring Phys: [id number] Rebecca Morse   Sonographer Comments: Image acquisition challenging due to patient body habitus. IMPRESSIONS   1. Left ventricular ejection fraction, by estimation, is 60 to 65%. The left ventricle has normal function. The left ventricle has no regional wall motion abnormalities. Left ventricular diastolic parameters were normal. The average left ventricular global longitudinal strain is -17.0 %. The global longitudinal strain is borderline normal. 2. Right ventricular systolic function is normal. The right ventricular size is normal. 3. The mitral valve is degenerative. Mild mitral valve regurgitation. No evidence of mitral stenosis. 4. The aortic valve is normal in structure. Aortic valve regurgitation is not visualized. Aortic valve sclerosis/calcification is present, without any evidence of aortic stenosis. 5. Aortic Normal DTA. 6. The  inferior vena cava is normal in size with <50% respiratory variability, suggesting right atrial pressure of 8 mmHg.  FINDINGS Left Ventricle: Left ventricular ejection fraction, by estimation, is 60 to 65%. The left ventricle has normal function. The left ventricle has no regional wall motion abnormalities. The average left ventricular global longitudinal strain is -17.0 %. Strain was performed and the global longitudinal strain is normal. The left ventricular internal cavity size was normal in size. There is no left ventricular hypertrophy. Left ventricular diastolic parameters were normal. Normal left ventricular filling pressure.  Right Ventricle: The right ventricular size is normal. No increase in right ventricular wall thickness. Right ventricular systolic function is normal.  Left Atrium: Left atrial size was normal in size.  Right Atrium: Right atrial size was normal in size.  Pericardium:  There is no evidence of pericardial effusion.  Mitral Valve: The mitral valve is degenerative in appearance. There is mild thickening of the anterior mitral valve leaflet(s). There is mild calcification of the anterior mitral valve leaflet(s). Mild mitral annular calcification. Mild mitral valve regurgitation. No evidence of mitral valve stenosis.  Tricuspid Valve: The tricuspid valve is normal in structure. Tricuspid valve regurgitation is not demonstrated. No evidence of tricuspid stenosis.  Aortic Valve: The aortic valve is normal in structure. Aortic valve regurgitation is not visualized. Aortic valve sclerosis/calcification is present, without any evidence of aortic stenosis.  Pulmonic Valve: The pulmonic valve was normal in structure. Pulmonic valve regurgitation is not visualized. No evidence of pulmonic stenosis.  Aorta: The aortic root is normal in size and structure and Normal DTA.  Venous: The right upper pulmonary vein is normal. The inferior vena cava is normal in size with less than  50% respiratory variability, suggesting right atrial pressure of 8 mmHg.  IAS/Shunts: No atrial level shunt detected by color flow Doppler.   LEFT VENTRICLE PLAX 2D LVIDd:         4.90 cm   Diastology LVIDs:         3.30 cm   LV e' medial:    7.72 cm/s LV PW:         1.10 cm   LV E/e' medial:  14.0 LV IVS:        1.10 cm   LV e' lateral:   9.68 cm/s LVOT diam:     2.20 cm   LV E/e' lateral: 11.2 LV SV:         79 LV SV Index:   38        2D Longitudinal Strain LVOT Area:     3.80 cm  2D Strain GLS Avg:     -17.0 %   RIGHT VENTRICLE             IVC RV Basal diam:  3.80 cm     IVC diam: 2.00 cm RV Mid diam:    3.10 cm RV S prime:     14.90 cm/s TAPSE (M-mode): 2.4 cm  LEFT ATRIUM             Index        RIGHT ATRIUM           Index LA diam:        3.30 cm 1.59 cm/m   RA Area:     19.60 cm LA Vol (A2C):   69.6 ml 33.59 ml/m  RA Volume:   51.30 ml  24.76 ml/m LA Vol (A4C):   59.2 ml 28.57 ml/m LA Biplane Vol: 64.7 ml 31.23 ml/m AORTIC VALVE LVOT Vmax:   85.50 cm/s LVOT Vmean:  56.900 cm/s LVOT VTI:    0.208 m  AORTA Ao Root diam: 3.30 cm Ao Asc diam:  3.30 cm Ao Desc diam: 2.10 cm  MITRAL VALVE MV Area (PHT): 2.87 cm     SHUNTS MV Decel Time: 264 msec     Systemic VTI:  0.21 m MR Peak grad: 80.3 mmHg     Systemic Diam: 2.20 cm MR Vmax:      448.00 cm/s MV E velocity: 108.00 cm/s MV A velocity: 125.00 cm/s MV E/A ratio:  0.86  Rebecca Leiter MD Electronically signed by Rebecca Leiter MD Signature Date/Time: 03/05/2024/4:59:21 PM    Final      CT SCANS  CT CORONARY MORPH W/CTA COR W/SCORE 03/14/2024  Addendum 05/13/2024  11:00 AM ADDENDUM REPORT: 05/13/2024 10:58  ADDENDUM: EXAM: OVER-READ INTERPRETATION CT CHEST POST LEFT SHOULDER ARTHROPLASTY  TECHNIQUE: The following report is an over-read performed by radiologist Dr. Toribio Faes of [facility]. This over-read does not include interpretation of cardiac or coronary anatomy or  pathology; that interpretation by cardiologist is attached. The study was performed with and without 100 mL of iohexol  (OMNIPAQUE ) 350 MG/ML injection.  COMPARISON: 10/06/23 and previous  CLINICAL HISTORY: Chest pain, nonspecific. Precordial pain. 73 year old white female.  FINDINGS:  No pleural or pericardial effusion.  No mass or adenopathy in visualized mediastinum. Moderate calcified aortic plaque.  Visualized upper abdomen unremarkable.  No pneumothorax. Benign calcified granuloma, anterior right upper lobe (601:140).  Vertebral endplate spurring at multiple levels in the lower thoracic spine.  IMPRESSION: 1. No acute findings.   Electronically Signed By: JONETTA Faes M.D. On: 05/13/2024 10:58  Narrative CLINICAL DATA:  CP  EXAM: Cardiac/Coronary  CTA  TECHNIQUE: The patient was scanned on a GE Apex scanner.  FINDINGS: A 120 kV prospective scan was triggered in the descending thoracic aorta at 111 HU's. Axial non-contrast 3 mm slices were carried out through the heart. The data set was analyzed on a dedicated work station and scored using the Agatson method. Gantry rotation speed was 250 msecs and collimation was .6 mm. No beta blockade and 0.8 mg of sl NTG was given. The 3D data set was reconstructed at 47% of the R-R cycle. Diastolic phases were analyzed on a dedicated work station using MPR, MIP and VRT modes. The patient received 80 cc of contrast.  Aorta: Normal size. Heavy calcifications noted in the descending portion of the aorta. No dissection.  Moderate Mitral annulus calcifications noted.  Aortic Valve:  Trileaflet.  No calcifications.  Coronary Arteries:  Normal coronary origin.  Right dominance.  RCA is a large dominant artery that gives rise to PDA and PLA. There is mixed plaque at the ostium with moderate stenosis of 50-69%. PDA has small calcified plaques with mild stenosis of 25-49%.  Left main is a large artery that gives rise  to LAD and LCX arteries as well as large Intermediate Branch. There is small calcified plaque at the ostium of LM with minimal stenosis of 0-24%.  LAD is a large vessel that has mixed plaque in its mid portion with moderate stenosis of 50 - 69%. This artery gives rise to moderate size D1. D1 is diffusely diseased in its proximal portion with moderate stenosis of 50-69%  Large Intermediate Branch has multiple mixed plaques in its proximal portion with mild stenosis of 25-49%.  LCX is a moderate size.   There is no plaque.  Other findings:  Normal pulmonary vein drainage into the left atrium.  Normal left atrial appendage without a thrombus.  Normal size of the pulmonary artery.  ASD is noted measuring 9.3 mm with left to right flow.  Mild RA enlargement.  IMPRESSION: 1. Coronary calcium score of 279. This was 59 percentile for age and sex matched control.  2. Normal coronary origin with right dominance.  3. CAD-RADS 3. Moderate stenosis. (50-69%) ostial RCA, mid LAD, D1. Consider symptom-guided anti-ischemic pharmacotherapy as well as risk factor modification per guideline directed care. Additional analysis with CT FFR will be submitted.  4. ASD (secundum) is noted measuring 9.3 mm with left to right flow.  5. Mild RA enlargement.  Electronically Signed: By: Lamar Fitch M.D. On: 03/16/2024 19:56     ______________________________________________________________________________________________  Recent Labs: 02/07/2024: BUN 25; Creatinine, Ser 1.00; Potassium 4.2; Sodium 143  Recent Lipid Panel  Recent lipid profile 01/24/2024 cholesterol 160 LDL 97 triglycerides 140 non-HDL cholesterol 122  Physical Exam:    VS:  BP 126/72   Pulse 68   Ht 5' 6 (1.676 m)   Wt 220 lb (99.8 kg)   SpO2 93%   BMI 35.51 kg/m     Wt Readings from Last 3 Encounters:  05/21/24 220 lb (99.8 kg)  02/07/24 217 lb 6.4 oz (98.6 kg)  03/16/20 201 lb 9.6 oz (91.4 kg)      GEN:  Well nourished, well developed in no acute distress HEENT: Normal NECK: No JVD; No carotid bruits LYMPHATICS: No lymphadenopathy CARDIAC: RRR, no murmurs, rubs, gallops RESPIRATORY:  Clear to auscultation without rales, wheezing or rhonchi  ABDOMEN: Soft, non-tender, non-distended MUSCULOSKELETAL:  No edema; No deformity  SKIN: Warm and dry NEUROLOGIC:  Alert and oriented x 3 PSYCHIATRIC:  Normal affect    Signed, Rebecca Leiter, MD  05/21/2024 3:26 PM    [facility]      [1]  Current Meds  Medication Sig   albuterol (VENTOLIN HFA) 108 (90 Base) MCG/ACT inhaler Inhale 2 puffs into the lungs every 4 (four) hours as needed.   ALPRAZolam  (XANAX ) 0.5 MG tablet Take 0.5 mg by mouth 3 (three) times daily as needed for anxiety.    amLODipine  (NORVASC ) 10 MG tablet Take 10 mg by mouth daily.   atenolol  (TENORMIN ) 50 MG tablet Take 50 mg by mouth daily.   FLUoxetine (PROZAC) 20 MG capsule Take 20 mg by mouth daily.   lisinopril  (PRINIVIL ,ZESTRIL ) 20 MG tablet Take 20 mg by mouth daily.   MAGNESIUM  CITRATE PO Take 250 mg by mouth daily.   Multiple Vitamin (MULTIVITAMIN) tablet Take 1 tablet by mouth daily.   nitroGLYCERIN  (NITROSTAT ) 0.4 MG SL tablet Place 0.4 mg under the tongue every 5 (five) minutes as needed for chest pain.   [DISCONTINUED] fenofibrate 160 MG tablet Take 160 mg by mouth daily.   [DISCONTINUED] Probiotic Product (FORTIFY DAILY PROBIOTIC PO) Take 1 tablet by mouth daily.

## 2024-05-21 ENCOUNTER — Encounter: Payer: Self-pay | Admitting: Cardiology

## 2024-05-21 ENCOUNTER — Telehealth: Payer: Self-pay | Admitting: Pharmacy Technician

## 2024-05-21 ENCOUNTER — Ambulatory Visit: Payer: Medicare (Managed Care) | Attending: Cardiology | Admitting: Cardiology

## 2024-05-21 VITALS — BP 126/72 | HR 68 | Ht 66.0 in | Wt 220.0 lb

## 2024-05-21 DIAGNOSIS — Q2111 Secundum atrial septal defect: Secondary | ICD-10-CM | POA: Diagnosis not present

## 2024-05-21 DIAGNOSIS — I10 Essential (primary) hypertension: Secondary | ICD-10-CM | POA: Diagnosis not present

## 2024-05-21 DIAGNOSIS — R931 Abnormal findings on diagnostic imaging of heart and coronary circulation: Secondary | ICD-10-CM

## 2024-05-21 DIAGNOSIS — E782 Mixed hyperlipidemia: Secondary | ICD-10-CM

## 2024-05-21 DIAGNOSIS — I25118 Atherosclerotic heart disease of native coronary artery with other forms of angina pectoris: Secondary | ICD-10-CM | POA: Diagnosis not present

## 2024-05-21 MED ORDER — DILTIAZEM HCL ER COATED BEADS 240 MG PO CP24: 240.0000 mg | ORAL_CAPSULE | Freq: Every day | ORAL | 3 refills | Status: DC

## 2024-05-21 MED ORDER — BEMPEDOIC ACID 180 MG PO TABS: 180.0000 mg | ORAL_TABLET | Freq: Every day | ORAL | 3 refills | Status: AC

## 2024-05-21 MED ORDER — ASPIRIN 81 MG PO TBEC: 81.0000 mg | DELAYED_RELEASE_TABLET | Freq: Every day | ORAL | 3 refills | Status: AC

## 2024-05-21 NOTE — Patient Instructions (Signed)
 Medication Instructions:  Your physician has recommended you make the following change in your medication:   START: Bempedoic Acid  180 mg daily START: Aspirin  81 mg daily START: Cardizem  CD 240 mg daily STOP: Fenofibrate STOP: Atenolol  STOP: Amlodipine    *If you need a refill on your cardiac medications before your next appointment, please call your pharmacy*  Lab Work: Your physician recommends that you return for lab work in:   Labs in 2 months: CMP, Lipids, Apo B  If you have labs (blood work) drawn today and your tests are completely normal, you will receive your results only by: MyChart Message (if you have MyChart) OR A paper copy in the mail If you have any lab test that is abnormal or we need to change your treatment, we will call you to review the results.  Testing/Procedures: None  Follow-Up: At [facility], you and your health needs are our priority.  As part of our continuing mission to provide you with exceptional heart care, our providers are all part of one team.  This team includes your primary Cardiologist (physician) and Advanced Practice Providers or APPs (Physician Assistants and Nurse Practitioners) who all work together to provide you with the care you need, when you need it.  Your next appointment:   1 year(s)  Provider:   Redell Leiter, MD    We recommend signing up for the patient portal called MyChart.  Sign up information is provided on this After Visit Summary.  MyChart is used to connect with patients for Virtual Visits (Telemedicine).  Patients are able to view lab/test results, encounter notes, upcoming appointments, etc.  Non-urgent messages can be sent to your provider as well.   To learn more about what you can do with MyChart, go to [email or web address].   Other Instructions None

## 2024-05-21 NOTE — Telephone Encounter (Signed)
 Pharmacy Patient Advocate Encounter  Received notification from [employer] that Prior Authorization for nexletol  has been APPROVED from 04/21/24 to 05/21/25   PA #/Case ID/Reference #: [id number]

## 2024-05-21 NOTE — Telephone Encounter (Signed)
° °  Pharmacy Patient Advocate Encounter   Received notification from CoverMyMeds that prior authorization for nexletol  is required/requested.   Insurance verification completed.   The patient is insured through [employer].   Per test claim: PA required; PA submitted to above mentioned insurance via Latent Key/confirmation #/EOC [id number] Status is pending

## 2024-07-02 ENCOUNTER — Other Ambulatory Visit: Payer: Self-pay

## 2024-07-02 MED ORDER — DILTIAZEM HCL ER COATED BEADS 240 MG PO CP24: 240.0000 mg | ORAL_CAPSULE | Freq: Every day | ORAL | 2 refills | Status: AC

## 2024-07-04 NOTE — Progress Notes (Unsigned)
 "  Initial neurology clinic note  Rebecca Morse MRN: [id number] DOB: [birth year]  Referring provider: Sabas Norleen PARAS., MD  Primary care provider: Sabas Norleen PARAS., MD  Reason for consult:  headache and tremors  Subjective:  This is Rebecca Morse, a 74 y.o. ***-handed female with a medical history of HTN, HLD, pre-DM, fibromyalgia, OA, anxiety, depression*** who presents to neurology clinic with headaches and tremors. The patient is accompanied by ***.  *** Saw PCP on 01/18/24 (Dr. Sabas) and mentioned generalized headaches for 6 months Daily headaches Last 1/2 to full day Photophobia Intermittent hazy vision   Tremor in hands for last year   Burning sensation in feet Right carpal tunnel syndrome?   On fluoxetine 20 mg daily Wegovy or Mounjaro - DM or weight loss?   Allergy to statins and Cymbalta    Smoker: OCP use: Caffiene use: EtOH use: Restrictive diet: Family history of neurologic disease including headaches:  MEDICATIONS:  Outpatient Encounter Medications as of 07/17/2024  Medication Sig   albuterol (VENTOLIN HFA) 108 (90 Base) MCG/ACT inhaler Inhale 2 puffs into the lungs every 4 (four) hours as needed.   ALPRAZolam  (XANAX ) 0.5 MG tablet Take 0.5 mg by mouth 3 (three) times daily as needed for anxiety.    aspirin  EC 81 MG tablet Take 1 tablet (81 mg total) by mouth daily. Swallow whole.   Bempedoic Acid  180 MG TABS Take 1 tablet (180 mg total) by mouth daily.   diltiazem  (CARDIZEM  CD) 240 MG 24 hr capsule Take 1 capsule (240 mg total) by mouth daily.   FLUoxetine (PROZAC) 20 MG capsule Take 20 mg by mouth daily.   lisinopril  (PRINIVIL ,ZESTRIL ) 20 MG tablet Take 20 mg by mouth daily.   MAGNESIUM  CITRATE PO Take 250 mg by mouth daily.   Multiple Vitamin (MULTIVITAMIN) tablet Take 1 tablet by mouth daily.   nitroGLYCERIN  (NITROSTAT ) 0.4 MG SL tablet Place 0.4 mg under the tongue every 5 (five) minutes as needed for chest pain.   No  facility-administered encounter medications on file as of 07/17/2024.    PAST MEDICAL HISTORY: Past Medical History:  Diagnosis Date   Anxiety    on meds   Arthritis    Complication of anesthesia    DDD (degenerative disc disease), lumbar    Depression    on meds   Expected blood loss anemia 04/11/2013   Fibromyalgia    Headache    History of [facility] spotted fever    following a tick bite   Hypertension    on meds   Left upper quadrant pain 11/07/2017   Lipoma of abdominal wall 11/07/2017   Lumbar herniated disc    Obese 04/11/2013   Pain of left hip joint 05/23/2018   PONV (postoperative nausea and vomiting)    Nausea only   Postoperative examination 12/26/2017   S/P left THA, 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   S/P right THA, AA 04/09/2013   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   Spinal stenosis, lumbar    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    PAST SURGICAL HISTORY: Past Surgical History:  Procedure Laterality Date   ABDOMINAL HYSTERECTOMY     BACK SURGERY     CHOLECYSTECTOMY  1974   COLONOSCOPY  03/2013   RG-TA x 7 frags   POLYPECTOMY  2014   RG-TA x 7 frags   TONSILLECTOMY     TOTAL HIP ARTHROPLASTY Right 04/09/2013   Procedure: RIGHT TOTAL HIP ARTHROPLASTY ANTERIOR APPROACH;  Surgeon: Donnice BIRCH  Ernie, MD;  Location: [facility];  Service: Orthopedics;  Laterality: Right;   TOTAL HIP ARTHROPLASTY Left 06/18/2018   Procedure: TOTAL HIP ARTHROPLASTY ANTERIOR APPROACH;  Surgeon: Ernie Cough, MD;  Location: [facility];  Service: Orthopedics;  Laterality: Left;  90 mins   TOTAL SHOULDER ARTHROPLASTY Left 07/31/2015   Procedure: TOTAL SHOULDER ARTHROPLASTY;  Surgeon: Marcey Her, MD;  Location: [facility];  Service: Orthopedics;  Laterality: Left;   TUBAL LIGATION     WISDOM TOOTH EXTRACTION      ALLERGIES: Allergies[1]  FAMILY HISTORY: Family History  Problem Relation Age of Onset   Colon polyps Neg Hx    Colon cancer Neg Hx    Esophageal cancer Neg Hx    Rectal cancer  Neg Hx    Stomach cancer Neg Hx     SOCIAL HISTORY: Social History[2] Social History   Social History Narrative   Not on file    Objective:  Vital Signs:  There were no vitals taken for this visit.  ***  Labs and Imaging review: Internal labs: ***   External labs: 01/18/24: TSH wnl CBC w/ diff unremarkable CMP unremarkable Lipid panel: tChol 155, LDL 91, TG 156 HbA1c: 5.9 A87: 566 Folate > 20.0   Imaging/Procedures: CT head and cervical spine wo contrast (external 06/20/21): IMPRESSION: There are no findings to suggest an acute fracture or subluxation within the cervical spine.   No acute intracranial hemorrhage is seen.   There is mild cerebral atrophy and periventricular white matter changes are seen.    Lumbar spine xray (09/08/2009): Findings: There are five lumbar-type vertebral bodies.  Diffuse  degenerative disc disease and facet disease throughout the lumbar  spine.  Normal alignment.  No fracture.  SI joints are  unremarkable.    IMPRESSION:  Diffuse degenerative disc disease and facet disease.  No acute  findings.  ***  Assessment/Plan:  Rebecca Morse is a 73 y.o. female who presents for evaluation of ***. *** has a relevant medical history of ***. *** neurological examination is pertinent for ***. Available diagnostic data is significant for ***. This constellation of symptoms and objective data would most likely localize to ***. ***  PLAN: -Blood work: *** ***  -Return to clinic ***  The impression above as well as the plan as outlined below were extensively discussed with the patient (in the company of ***) who voiced understanding. All questions were answered to their satisfaction.  The patient was counseled on pertinent fall precautions per the printed material provided today, and as noted under the Patient Instructions section below.***  When available, results of the above investigations and possible further recommendations will be  communicated to the patient via telephone/MyChart. Patient to call office if not contacted after expected testing turnaround time.   Total time spent reviewing records, interview, history/exam, documentation, and coordination of care on day of encounter:  *** min   Thank you for allowing me to participate in patient's care.  If I can answer any additional questions, I would be pleased to do so.  Venetia Potters, MD   CC: Sabas Norleen PARAS., MD [phone] [place] [address]  CC: Referring provider: Sabas Norleen PARAS., MD [address] [place],  [place] [address]    [1]  Allergies Allergen Reactions   Cefazolin  Hives, Other (See Comments), Rash and Dermatitis    Allergic reaction post infusion had increased peak pressure, red rash,, and hives  Allergic reaction post infusion had increased peak pressure, red rash,,  and hives  Allergic reaction post infusion had increased peak pressure, red rash,, and hives   Cefaclor Hives    Rash also   Other Other (See Comments)    Unknown   Statins     Other Reaction(s): Unknown  [2]  Social History Tobacco Use   Smoking status: Former    Current packs/day: 0.00    Average packs/day: 1 pack/day for 34.0 years (34.0 ttl pk-yrs)    Types: Cigarettes    Start date: 06/06/1964    Quit date: 06/06/1998    Years since quitting: 26.0   Smokeless tobacco: Never  Vaping Use   Vaping status: Never Used  Substance Use Topics   Alcohol use: Yes    Comment: occassionally   Drug use: No   "

## 2024-07-17 ENCOUNTER — Ambulatory Visit: Payer: Medicare (Managed Care) | Admitting: Neurology
